# Patient Record
Sex: Female | Born: 1955 | Race: Black or African American | Hispanic: No | Marital: Single | Smoking: Former smoker
Health system: Southern US, Community
[De-identification: ages and names within clinical notes are randomized; demographics above are authoritative.]

## PROBLEM LIST (undated history)

## (undated) DIAGNOSIS — I10 Essential (primary) hypertension: Secondary | ICD-10-CM

## (undated) DIAGNOSIS — Z803 Family history of malignant neoplasm of breast: Secondary | ICD-10-CM

## (undated) DIAGNOSIS — J45909 Unspecified asthma, uncomplicated: Secondary | ICD-10-CM

## (undated) DIAGNOSIS — C541 Malignant neoplasm of endometrium: Secondary | ICD-10-CM

## (undated) DIAGNOSIS — H9190 Unspecified hearing loss, unspecified ear: Secondary | ICD-10-CM

## (undated) DIAGNOSIS — E119 Type 2 diabetes mellitus without complications: Secondary | ICD-10-CM

## (undated) DIAGNOSIS — J449 Chronic obstructive pulmonary disease, unspecified: Secondary | ICD-10-CM

## (undated) HISTORY — PX: BLADDER SURGERY: SHX569

## (undated) HISTORY — DX: Unspecified asthma, uncomplicated: J45.909

## (undated) HISTORY — DX: Family history of malignant neoplasm of breast: Z80.3

## (undated) HISTORY — PX: COLONOSCOPY: SHX174

---

## 2014-01-31 ENCOUNTER — Emergency Department (HOSPITAL_COMMUNITY)
Admission: EM | Admit: 2014-01-31 | Discharge: 2014-01-31 | Disposition: A | Discharge status: Home / Self Care | Payer: Self-pay | Attending: Emergency Medicine | Admitting: Emergency Medicine

## 2014-01-31 ENCOUNTER — Encounter (HOSPITAL_COMMUNITY): Payer: Self-pay | Admitting: Emergency Medicine

## 2014-01-31 ENCOUNTER — Emergency Department (HOSPITAL_COMMUNITY): Payer: Self-pay

## 2014-01-31 DIAGNOSIS — F17200 Nicotine dependence, unspecified, uncomplicated: Secondary | ICD-10-CM | POA: Insufficient documentation

## 2014-01-31 DIAGNOSIS — M7730 Calcaneal spur, unspecified foot: Secondary | ICD-10-CM | POA: Insufficient documentation

## 2014-01-31 DIAGNOSIS — I10 Essential (primary) hypertension: Secondary | ICD-10-CM | POA: Insufficient documentation

## 2014-01-31 DIAGNOSIS — H9190 Unspecified hearing loss, unspecified ear: Secondary | ICD-10-CM | POA: Insufficient documentation

## 2014-01-31 HISTORY — DX: Essential (primary) hypertension: I10

## 2014-01-31 HISTORY — DX: Unspecified hearing loss, unspecified ear: H91.90

## 2014-01-31 MED ORDER — MELOXICAM 7.5 MG PO TABS: 15.0000 mg | ORAL_TABLET | Freq: Every day | ORAL | Status: DC

## 2014-01-31 NOTE — Discharge Instructions (Signed)
Take the prescribed medication as directed. Follow-up with orthopedics for further management of your foot pain. Please establish care with a primary care physician in the area for management of medical problems including high blood pressure.  Resource guide attached to help with this. Return to the ED for new or worsening symptoms.   Emergency Department Resource Guide 1) Find a Doctor and Pay Out of Pocket Although you won't have to find out who is covered by your insurance plan, it is a good idea to ask around and get recommendations. You will then need to call the office and see if the doctor you have chosen will accept you as a new patient and what types of options they offer for patients who are self-pay. Some doctors offer discounts or will set up payment plans for their patients who do not have insurance, but you will need to ask so you aren't surprised when you get to your appointment.  2) Contact Your Local Health Department Not all health departments have doctors that can see patients for sick visits, but many do, so it is worth a call to see if yours does. If you don't know where your local health department is, you can check in your phone book. The CDC also has a tool to help you locate your state's health department, and many state websites also have listings of all of their local health departments.  3) Find a Walk-in Clinic If your illness is not likely to be very severe or complicated, you may want to try a walk in clinic. These are popping up all over the country in pharmacies, drugstores, and shopping centers. They're usually staffed by nurse practitioners or physician assistants that have been trained to treat common illnesses and complaints. They're usually fairly quick and inexpensive. However, if you have serious medical issues or chronic medical problems, these are probably not your best option.  No Primary Care Doctor: - Call Health Connect at  [phone] - they can help you  locate a primary care doctor that  accepts your insurance, provides certain services, etc. - Physician Referral Service- [phone]  Chronic Pain Problems: Organization         Address  Phone   Notes  [facility]  [phone] Patients need to be referred by their primary care doctor.   Medication Assistance: Organization         Address  Phone   Notes  [facility] Medication [facility] [address] [place] [place], [place] [address] [phone] --Must be a resident of [facility] -- Must have NO insurance coverage whatsoever (no Medicaid/ Medicare, etc.) -- The pt. MUST have a primary care doctor that directs their care regularly and follows them in the community   [facility]  [phone]   [employer]  [phone]    Agencies that provide inexpensive medical care: Organization         Address  Phone   Notes  [facility]  [phone]   [facility]    [phone]   [facility] [address] [place], [place] [address] [phone]   [facility] [address] [place], [place] [phone]   [facility]anned Parenthood    [phone]   [facility]    [phone]   Community Health and [facility]  [address], [place] Phone:  [phone], Fax:  [phone] Hours of  Operation:  9 am - 6 pm, M-F.  Also accepts Medicaid/Medicare and self-pay.  [facility] for [place], [address], [place] Phone: [phone], Fax: [phone]. Hours of Operation:  8:30 am - 5:30 pm, M-F.  Also accepts Medicaid and self-pay.  [facility] [address] [place] Phone: [phone]   [place], [place], [place] [phone], Ext. [phone] Mondays & Thursdays: 7-9 AM.  First 15 patients are seen on a first come, first serve basis.    [place]  Providers:  Organization         Address  Phone   Notes  [facility] [address], [place] [phone] Also accepts self-pay patients.  [facility] [address], [place]  [phone]   [place], [address], [place] [phone]   [facility] [address] [place] [phone]   Lucianne Lei [address], [place]   [phone] Only accepts [place] Access [place] patients after they have their name applied to their card.   Self-Pay (no insurance) in [facility]:  Organization         Address  Phone   Notes  Sickle Cell Patients, [facility] Internal Medicine [place] [phone]   [facility] [place] [phone]   [facility] [place]  [place], [place], [place] [phone]   [facility]/Dr. Osei-Bonsu  [address] [place] or [address], [place] [phone] Phone number for both [place] and [place] locations is the same.  Urgent Medical and [facility] [address] [place] [phone]   [facility] [address] [place] or [address] [phone]   [facility] [address] [place] [phone], phone; [phone], fax Sees patients 1st and 3rd Saturday of every month.  Must not qualify for public or private insurance (i.e. Medicaid, Medicare, [place] Health Choice, Veterans' Benefits)  Household income should be no more than 200% of the poverty level The clinic cannot treat you if you are pregnant or think you are pregnant  Sexually transmitted diseases are not treated at the clinic.    Dental Care: Organization         Address  Phone  Notes  [facility] of [facility] [place] [phone] Accepts children up to age 91 who are enrolled in [place] or [place]; pregnant women with a Medicaid card; and children who have applied for Medicaid or [place] Health Choice, but were declined, whose parents can pay a reduced fee at time of service.  [facility] Department of [facility]  [address], [place] [phone] Accepts children up to age 110 who are enrolled in [place] or [place]; pregnant women with a Medicaid card; and children who have applied for Medicaid or [place] Health Choice, but were declined, whose parents can pay a reduced fee at time of service.  [facility]  [place] [phone] Patients are seen by appointment only. Walk-ins are not accepted. [place] will see patients  37 years of age and older. Monday - Tuesday (8am-5pm) Most Wednesdays (8:30-5pm) $30 per visit, cash only  [facility] Adult Dental Access PROGRAM  [address], [facility] [phone] Patients are seen by appointment only. Walk-ins are not accepted. [place] will see patients 38 years of age and older. One Wednesday Evening (Monthly: Volunteer Based).  $30 per visit, cash only  [place]  [phone] for adults; Children under age 20, call Graduate Pediatric Dentistry at [phone]. Children aged 64-14, please call [phone] to request a pediatric application.  Dental services are provided in all areas of dental care including fillings, crowns and bridges, complete and partial dentures, implants, gum treatment, root canals, and extractions. Preventive care is also provided. Treatment is provided to both adults and children. Patients are selected via a lottery and there is often a waiting list.   [facility] [address] [place]  [phone] [email or web address]   [facility]  [address] [place], [place] [phone], Ext. [phone] Second and Fourth Thursday of each month, opens at 6:30 AM; Clinic ends at 9 AM.  Patients are seen on a first-come first-served basis, and a limited number are seen during each clinic.   [facility]  [address] Hillard Danker [place], [place] [phone]   Eligibility Requirements You must have lived in [place], [place], or [place] counties for at least the last three months.   You cannot be eligible for state or federal sponsored [employer], including [employer], [place], or [employer].   You generally cannot be eligible for healthcare insurance through your employer.    How to apply: Eligibility screenings are held every Tuesday and Wednesday afternoon from 1:00 pm until 4:00 pm. You do not need an appointment for the interview!  [facility] [address] [place], [place]   [place]  [place] Department  [place]  [phone]    Behavioral Health Resources in the Community: Intensive Outpatient Programs Organization         Address  Phone  Notes  [facility] [place]. [address] [place], [place] [phone]   [facility] Outpatient [address] [place], [place]   ADS: [facility] [address] [place], [place]   [place] [address],  [place], [place] or [phone]   Substance Abuse Resources Organization         Address  Phone  Notes  [facility] Drug Services  [phone]   [place]  [phone]   [facility] [place]   Chinita Pester  [phone]   Residential & Outpatient Substance Abuse Program  [phone]   Psychological Services Organization         Address  Phone  Notes  [facility] [place]  [place]  [phone]   [place] [address], [place] or [phone]    Mobile Crisis Teams Organization         Address  Phone  Notes  [facility], Mobile Crisis Care Unit  [phone]   [facility]  [address] [place], [place]   [facility] [address] [place] [phone]    Self-Help/Support Groups Organization  Address  Phone             Notes  [place]. of [place] - variety of support groups  [place] Call for more information  Narcotics Anonymous (NA), [facility] [address], [employer] [place]  2 meetings at this location   Special educational needs teacher         Address  Phone  Notes  [facility] Residential Treatment [place],    [place]  [phone]   [facility]  [address], [place] 329924, [place], [place]   [place], [place] [phone] Admissions: 8am-3pm M-F  Incentives Substance [place] [address],    [place], [place] [phone]   [facility] [address] [place], [place], [place]   [facility] [address],  [place], [place]   [facility] Programs - Intensive Outpatient [address], [place], [place]   [facility]  [place], [place] [phone] or [phone]   [facility] (RTS) [address] [place], [place] Accepts Medicaid  [facility] [address]  [place] [phone] Substance Abuse/Addiction Treatment   [facility] Organization         Address  Phone  Notes  [facility]  [phone]   Domenic Schwab, PhD [address] Arlis Porta [place], [place]   [phone] or [phone]    [place], [place] [phone]   [facility] [address], [place], [place] [phone] Insurance/Medicaid/sponsorship through [facility] and Families [address]                                    [place], [place] [phone] [place] [address], [place] [phone]    Dr. Adele Schilder  [phone]   [facility] Dept. 1) [address], [place] 2) [place] 3)  [place] [address], [place] [phone]  [phone]   [place] [phone] or [phone] (After Hours)

## 2014-01-31 NOTE — ED Provider Notes (Signed)
CSN: [id number]     Arrival date & time 01/31/14  1136 History  This chart was scribed for non-physician practitioner Sharilyn SitesLisa Tyliah Schlereth, PA-C working with Doug SouSam Jacubowitz, MD by Leone PayorSonum Patel, ED Scribe. This patient was seen in room TR05C/TR05C and the patient's care was started at 1:42 PM.    Chief Complaint  Patient presents with  . Foot Pain      The history is provided by the patient. The history is limited by a language barrier. A language interpreter was used.   HPI Comments: Katelyn AbideMichele Winters is a 58 y.o. female who presents to the Emergency Department complaining of constant, unchanged right heel pain that began 2 years ago. Patient reports similar symptoms on the left heel which required surgery. She is also requesting HTN medication refill today-- does not know what prior medications were. She denies numbness, paresthesias, or weakness of right foot.  No intervention tried PTA.   Past Medical History  Diagnosis Date  . Hypertension   . Deaf    No past surgical history on file. No family history on file. History  Substance Use Topics  . Smoking status: Current Every Day Smoker  . Smokeless tobacco: Not on file  . Alcohol Use: Yes   OB History   Grav Para Term Preterm Abortions TAB SAB Ect Mult Living                 Review of Systems  Musculoskeletal: Positive for arthralgias.  Neurological: Negative for weakness and numbness.  All other systems reviewed and are negative.     Allergies  Review of patient's allergies indicates no known allergies.  Home Medications   Prior to Admission medications   Not on File   BP 147/88  Pulse 69  Temp(Src) 98 F (36.7 C) (Oral)  SpO2 98%  Physical Exam  Nursing note and vitals reviewed. Constitutional: She is oriented to person, place, and time. She appears well-developed and well-nourished.  HENT:  Head: Normocephalic and atraumatic.  Mouth/Throat: Oropharynx is clear and moist.  Eyes: Conjunctivae and EOM are normal.  Pupils are equal, round, and reactive to light.  Neck: Normal range of motion.  Cardiovascular: Normal rate, regular rhythm, normal heart sounds and intact distal pulses.   Pulmonary/Chest: Effort normal and breath sounds normal.  Musculoskeletal: Normal range of motion. She exhibits tenderness.  Right heel tender along posterior aspect. Achilles non tender and appears intact. Strong distal pulses. Cap refill intact. Sensation intact. Ambulating without difficulty.   Neurological: She is alert and oriented to person, place, and time. No sensory deficit.  Skin: Skin is warm and dry.  Psychiatric: She has a normal mood and affect.    ED Course  Procedures (including critical care time)  DIAGNOSTIC STUDIES: Oxygen Saturation is 98% on RA, normal by my interpretation.    COORDINATION OF CARE: 1:43 PM Discussed treatment plan with pt at bedside and pt agreed to plan.   Labs Review Labs Reviewed - No data to display  Imaging Review Dg Foot Complete Right  01/31/2014   CLINICAL DATA:  Right foot pain  EXAM: RIGHT FOOT COMPLETE - 3+ VIEW  COMPARISON:  None.  FINDINGS: Calcaneal spurs are identified. Degenerative changes are noted in the tarsal bones. No acute fracture or dislocation seen. No soft tissue changes are noted.  IMPRESSION: Degenerative change without acute abnormality.   Electronically Signed   By: Alcide CleverMark  Lukens M.D.   On: 01/31/2014 13:34     EKG Interpretation None  MDM   Final diagnoses:  Heel spur   X-ray revealing calcaneal spurs which is likely the source of patient's pain. She was started on anti-inflammatories.  She has a history of hypertension but is not currently on any medications. She is unsure what her prior medications were. I strongly encouraged her to followup with a primary care physician in the area to establish care and restart her proper medications.  Pt also given orthopedic FU if needed.  Discussed plan with patient, he/she acknowledged understanding  and agreed with plan of care.  Return precautions given for new or worsening symptoms.  I personally performed the services described in this documentation, which was scribed in my presence. The recorded information has been reviewed and is accurate.  Garlon Hatchet, PA-C 01/31/14 1614

## 2014-01-31 NOTE — ED Notes (Signed)
Interpreter in. Pt c/o left heel pain x 2 years. No known injury. Here from [place] since April. Has not taken BP meds since then.

## 2014-01-31 NOTE — ED Notes (Signed)
Pt is deaf. Requests interpreter. C/o left heel pain x 2 years.

## 2014-01-31 NOTE — ED Provider Notes (Signed)
Medical screening examination/treatment/procedure(s) were performed by non-physician practitioner and as supervising physician I was immediately available for consultation/collaboration.   EKG Interpretation None       Doug Sou, MD 01/31/14 1615

## 2014-01-31 NOTE — ED Notes (Signed)
Called for interpreter 

## 2014-01-31 NOTE — Discharge Planning (Signed)
[facility] Mudlogger in the room. Patient states she recently moved to [facility] in April and hasn't established care with a provider. Patient was given a resource guide and orange card application. Informed patient that she might be a good candidate for medicaid. Pt states she has family who works in the system and expressed concerns of medicaid effecting her monthly income. Patient was given my contact information for any future questions or concerns.

## 2014-11-19 ENCOUNTER — Emergency Department (HOSPITAL_COMMUNITY): Payer: Self-pay

## 2014-11-19 ENCOUNTER — Emergency Department (HOSPITAL_COMMUNITY)
Admission: EM | Admit: 2014-11-19 | Discharge: 2014-11-19 | Disposition: A | Discharge status: Home / Self Care | Payer: Self-pay | Attending: Emergency Medicine | Admitting: Emergency Medicine

## 2014-11-19 ENCOUNTER — Encounter (HOSPITAL_COMMUNITY): Payer: Self-pay | Admitting: *Deleted

## 2014-11-19 DIAGNOSIS — Z791 Long term (current) use of non-steroidal anti-inflammatories (NSAID): Secondary | ICD-10-CM | POA: Insufficient documentation

## 2014-11-19 DIAGNOSIS — R1013 Epigastric pain: Secondary | ICD-10-CM | POA: Insufficient documentation

## 2014-11-19 DIAGNOSIS — R630 Anorexia: Secondary | ICD-10-CM | POA: Insufficient documentation

## 2014-11-19 DIAGNOSIS — I10 Essential (primary) hypertension: Secondary | ICD-10-CM | POA: Insufficient documentation

## 2014-11-19 DIAGNOSIS — R112 Nausea with vomiting, unspecified: Secondary | ICD-10-CM | POA: Insufficient documentation

## 2014-11-19 DIAGNOSIS — R109 Unspecified abdominal pain: Secondary | ICD-10-CM

## 2014-11-19 DIAGNOSIS — Z720 Tobacco use: Secondary | ICD-10-CM | POA: Insufficient documentation

## 2014-11-19 DIAGNOSIS — H9190 Unspecified hearing loss, unspecified ear: Secondary | ICD-10-CM | POA: Insufficient documentation

## 2014-11-19 LAB — URINALYSIS, ROUTINE W REFLEX MICROSCOPIC
Bilirubin Urine: NEGATIVE
Glucose, UA: NEGATIVE mg/dL
Hgb urine dipstick: NEGATIVE
Ketones, ur: NEGATIVE mg/dL
Leukocytes, UA: NEGATIVE
Nitrite: NEGATIVE
PH: 6 (ref 5.0–8.0)
Protein, ur: NEGATIVE mg/dL
Specific Gravity, Urine: 1.02 (ref 1.005–1.030)
UROBILINOGEN UA: 1 mg/dL (ref 0.0–1.0)

## 2014-11-19 LAB — CBC WITH DIFFERENTIAL/PLATELET
Basophils Absolute: 0 10*3/uL (ref 0.0–0.1)
Basophils Relative: 1 % (ref 0–1)
Eosinophils Absolute: 0.1 10*3/uL (ref 0.0–0.7)
Eosinophils Relative: 1 % (ref 0–5)
HCT: 38.6 % (ref 36.0–46.0)
Hemoglobin: 12.8 g/dL (ref 12.0–15.0)
LYMPHS ABS: 1.6 10*3/uL (ref 0.7–4.0)
LYMPHS PCT: 24 % (ref 12–46)
MCH: 32.8 pg (ref 26.0–34.0)
MCHC: 33.2 g/dL (ref 30.0–36.0)
MCV: 99 fL (ref 78.0–100.0)
Monocytes Absolute: 0.4 10*3/uL (ref 0.1–1.0)
Monocytes Relative: 6 % (ref 3–12)
NEUTROS ABS: 4.5 10*3/uL (ref 1.7–7.7)
Neutrophils Relative %: 68 % (ref 43–77)
PLATELETS: 273 10*3/uL (ref 150–400)
RBC: 3.9 MIL/uL (ref 3.87–5.11)
RDW: 12.3 % (ref 11.5–15.5)
WBC: 6.6 10*3/uL (ref 4.0–10.5)

## 2014-11-19 LAB — COMPREHENSIVE METABOLIC PANEL
ALBUMIN: 3.7 g/dL (ref 3.5–5.2)
ALK PHOS: 90 U/L (ref 39–117)
ALT: 14 U/L (ref 0–35)
AST: 17 U/L (ref 0–37)
Anion gap: 8 (ref 5–15)
BUN: 11 mg/dL (ref 6–23)
CHLORIDE: 105 mmol/L (ref 96–112)
CO2: 26 mmol/L (ref 19–32)
Calcium: 8.7 mg/dL (ref 8.4–10.5)
Creatinine, Ser: 0.91 mg/dL (ref 0.50–1.10)
GFR, EST AFRICAN AMERICAN: 78 mL/min — AB (ref >90)
GFR, EST NON AFRICAN AMERICAN: 67 mL/min — AB (ref >90)
GLUCOSE: 172 mg/dL — AB (ref 70–99)
Potassium: 3.9 mmol/L (ref 3.5–5.1)
Sodium: 139 mmol/L (ref 135–145)
Total Bilirubin: 0.8 mg/dL (ref 0.3–1.2)
Total Protein: 7.4 g/dL (ref 6.0–8.3)

## 2014-11-19 MED ORDER — ONDANSETRON HCL 4 MG PO TABS: 4.0000 mg | ORAL_TABLET | Freq: Four times a day (QID) | ORAL | Status: DC

## 2014-11-19 MED ORDER — MORPHINE SULFATE 4 MG/ML IJ SOLN
4.0000 mg | Freq: Once | INTRAMUSCULAR | Status: AC
  Administered 2014-11-19: 4 mg via INTRAVENOUS
  Filled 2014-11-19: qty 1

## 2014-11-19 MED ORDER — ONDANSETRON HCL 4 MG/2ML IJ SOLN
4.0000 mg | Freq: Once | INTRAMUSCULAR | Status: AC
  Administered 2014-11-19: 4 mg via INTRAVENOUS
  Filled 2014-11-19: qty 2

## 2014-11-19 MED ORDER — HYDROCODONE-ACETAMINOPHEN 5-325 MG PO TABS: 1.0000 | ORAL_TABLET | Freq: Four times a day (QID) | ORAL | Status: DC | PRN

## 2014-11-19 NOTE — ED Notes (Signed)
Bed: [id number] Expected date: 11/19/14 Expected time: 10:35 AM Means of arrival: Ambulance Comments: RM 25, abd pain

## 2014-11-19 NOTE — ED Notes (Signed)
ES reports pt developed abd pain 12 hours ago, nausea and vomiting. Rt flank pain upon palpation. PT IS HEARING IMPAIRED  IV l FA # 20, Zofran given

## 2014-11-19 NOTE — ED Notes (Signed)
Pt aware of need for urine specimen. 

## 2014-11-19 NOTE — ED Notes (Signed)
AVS explained with interpreter. Given cab voucher per social work. No other c/c.

## 2014-11-19 NOTE — ED Notes (Signed)
Interpreter at bedside for effective communication purposes. MD Plunkett at bedside to assess patient.

## 2014-11-19 NOTE — Discharge Instructions (Signed)
Abdominal Pain, Women °Abdominal (stomach, pelvic, or belly) pain can be caused by many things. It is important to tell your doctor: °· The location of the pain. °· Does it come and go or is it present all the time? °· Are there things that start the pain (eating certain foods, exercise)? °· Are there other symptoms associated with the pain (fever, nausea, vomiting, diarrhea)? °All of this is helpful to know when trying to find the cause of the pain. °CAUSES  °· Stomach: virus or bacteria infection, or ulcer. °· Intestine: appendicitis (inflamed appendix), regional ileitis (Crohn's disease), ulcerative colitis (inflamed colon), irritable bowel syndrome, diverticulitis (inflamed diverticulum of the colon), or cancer of the stomach or intestine. °· Gallbladder disease or stones in the gallbladder. °· Kidney disease, kidney stones, or infection. °· Pancreas infection or cancer. °· Fibromyalgia (pain disorder). °· Diseases of the female organs: °¨ Uterus: fibroid (non-cancerous) tumors or infection. °¨ Fallopian tubes: infection or tubal pregnancy. °¨ Ovary: cysts or tumors. °¨ Pelvic adhesions (scar tissue). °¨ Endometriosis (uterus lining tissue growing in the pelvis and on the pelvic organs). °¨ Pelvic congestion syndrome (female organs filling up with blood just before the menstrual period). °¨ Pain with the menstrual period. °¨ Pain with ovulation (producing an egg). °¨ Pain with an IUD (intrauterine device, birth control) in the uterus. °¨ Cancer of the female organs. °· Functional pain (pain not caused by a disease, may improve without treatment). °· Psychological pain. °· Depression. °DIAGNOSIS  °Your doctor will decide the seriousness of your pain by doing an examination. °· Blood tests. °· X-rays. °· Ultrasound. °· CT scan (computed tomography, special type of X-ray). °· MRI (magnetic resonance imaging). °· Cultures, for infection. °· Barium enema (dye inserted in the large intestine, to better view it with  X-rays). °· Colonoscopy (looking in intestine with a lighted tube). °· Laparoscopy (minor surgery, looking in abdomen with a lighted tube). °· Major abdominal exploratory surgery (looking in abdomen with a large incision). °TREATMENT  °The treatment will depend on the cause of the pain.  °· Many cases can be observed and treated at home. °· Over-the-counter medicines recommended by your caregiver. °· Prescription medicine. °· Antibiotics, for infection. °· Birth control pills, for painful periods or for ovulation pain. °· Hormone treatment, for endometriosis. °· Nerve blocking injections. °· Physical therapy. °· Antidepressants. °· Counseling with a psychologist or psychiatrist. °· Minor or major surgery. °HOME CARE INSTRUCTIONS  °· Do not take laxatives, unless directed by your caregiver. °· Take over-the-counter pain medicine only if ordered by your caregiver. Do not take aspirin because it can cause an upset stomach or bleeding. °· Try a clear liquid diet (broth or water) as ordered by your caregiver. Slowly move to a bland diet, as tolerated, if the pain is related to the stomach or intestine. °· Have a thermometer and take your temperature several times a day, and record it. °· Bed rest and sleep, if it helps the pain. °· Avoid sexual intercourse, if it causes pain. °· Avoid stressful situations. °· Keep your follow-up appointments and tests, as your caregiver orders. °· If the pain does not go away with medicine or surgery, you may try: °¨ Acupuncture. °¨ Relaxation exercises (yoga, meditation). °¨ Group therapy. °¨ Counseling. °SEEK MEDICAL CARE IF:  °· You notice certain foods cause stomach pain. °· Your home care treatment is not helping your pain. °· You need stronger pain medicine. °· You want your IUD removed. °· You feel faint or   lightheaded. °· You develop nausea and vomiting. °· You develop a rash. °· You are having side effects or an allergy to your medicine. °SEEK IMMEDIATE MEDICAL CARE IF:  °· Your  pain does not go away or gets worse. °· You have a fever. °· Your pain is felt only in portions of the abdomen. The right side could possibly be appendicitis. The left lower portion of the abdomen could be colitis or diverticulitis. °· You are passing blood in your stools (bright red or black tarry stools, with or without vomiting). °· You have blood in your urine. °· You develop chills, with or without a fever. °· You pass out. °MAKE SURE YOU:  °· Understand these instructions. °· Will watch your condition. °· Will get help right away if you are not doing well or get worse. °Document Released: 06/15/2007 Document Revised: 01/02/2014 Document Reviewed: 07/05/2009 °ExitCare® Patient Information ©2015 [employer]. This information is not intended to replace advice given to you by your health care provider. Make sure you discuss any questions you have with your health care provider. ° °

## 2014-11-19 NOTE — ED Provider Notes (Signed)
CSN: [id number]     Arrival date & time 11/19/14  1037 History   First MD Initiated Contact with Patient 11/19/14 1044     Chief Complaint  Patient presents with  . Abdominal Pain     (Consider location/radiation/quality/duration/timing/severity/associated sxs/prior Treatment) Patient is a 59 y.o. female presenting with abdominal pain. The history is provided by the patient. The history is limited by a language barrier (pt is deaf). A language interpreter was used.  Abdominal Pain Pain location:  L flank Pain quality: sharp and stabbing   Pain radiates to:  Suprapubic region Pain severity:  Severe Onset quality:  Sudden Duration:  12 hours Timing:  Constant Progression:  Worsening Chronicity:  New Context: awakening from sleep   Relieved by:  Nothing Worsened by:  Urination Ineffective treatments:  Acetaminophen Associated symptoms: anorexia, dysuria, nausea and vomiting   Associated symptoms: no constipation, no cough, no fever and no hematuria   Associated symptoms comment:  States urine stream is slow Risk factors: has not had multiple surgeries and no recent hospitalization     Past Medical History  Diagnosis Date  . Hypertension   . Deaf    History reviewed. No pertinent past surgical history. No family history on file. History  Substance Use Topics  . Smoking status: Current Every Day Smoker  . Smokeless tobacco: Not on file  . Alcohol Use: Yes   OB History    No data available     Review of Systems  Constitutional: Negative for fever.  Respiratory: Negative for cough.   Gastrointestinal: Positive for nausea, vomiting, abdominal pain and anorexia. Negative for constipation.  Genitourinary: Positive for dysuria. Negative for hematuria.  All other systems reviewed and are negative.     Allergies  Review of patient's allergies indicates no known allergies.  Home Medications   Prior to Admission medications   Medication Sig Start Date End Date Taking?  Authorizing Provider  acetaminophen (TYLENOL) 325 MG tablet Take 325 mg by mouth every 6 (six) hours as needed for moderate pain.   Yes Historical Provider, MD  Multiple Vitamin (MULTIVITAMIN WITH MINERALS) TABS tablet Take 1 tablet by mouth daily.   Yes Historical Provider, MD  meloxicam (MOBIC) 7.5 MG tablet Take 2 tablets (15 mg total) by mouth daily. Patient not taking: Reported on 11/19/2014 01/31/14   Garlon HatchetLisa M Sanders, PA-C   BP 134/82 mmHg  Pulse 73  Temp(Src) 97.7 F (36.5 C) (Oral)  Resp 18  SpO2 98% Physical Exam  Constitutional: She is oriented to person, place, and time. She appears well-developed and well-nourished. No distress.  HENT:  Head: Normocephalic and atraumatic.  Mouth/Throat: Oropharynx is clear and moist.  Eyes: Conjunctivae and EOM are normal. Pupils are equal, round, and reactive to light.  Neck: Normal range of motion. Neck supple.  Cardiovascular: Normal rate, regular rhythm and intact distal pulses.   No murmur heard. Pulmonary/Chest: Effort normal and breath sounds normal. No respiratory distress. She has no wheezes. She has no rales.  Abdominal: Soft. She exhibits no distension. There is tenderness in the suprapubic area. There is CVA tenderness. There is no rebound and no guarding.  Severe left flank pain and mild right flank pain  Musculoskeletal: Normal range of motion. She exhibits no edema or tenderness.  Neurological: She is alert and oriented to person, place, and time.  Skin: Skin is warm and dry. No rash noted. No erythema.  Psychiatric: She has a normal mood and affect. Her behavior is normal.  Nursing  note and vitals reviewed.   ED Course  Procedures (including critical care time) Labs Review Labs Reviewed  COMPREHENSIVE METABOLIC PANEL - Abnormal; Notable for the following:    Glucose, Bld 172 (*)    GFR calc non Af Amer 67 (*)    GFR calc Af Amer 78 (*)    All other components within normal limits  CBC WITH DIFFERENTIAL/PLATELET   URINALYSIS, ROUTINE W REFLEX MICROSCOPIC    Imaging Review Ct Abdomen Pelvis Wo Contrast  11/19/2014   CLINICAL DATA:  Right flank pain  EXAM: CT ABDOMEN AND PELVIS WITHOUT CONTRAST  TECHNIQUE: Multidetector CT imaging of the abdomen and pelvis was performed following the standard protocol without IV contrast.  COMPARISON:  None.  FINDINGS: Lung bases demonstrate some minimal scarring on the right.  The gallbladder has been surgically removed. The liver, spleen, adrenal glands and pancreas are within normal limits. Kidneys are well visualized bilaterally. No renal calculi or obstructive changes are noted.  The appendix is within normal limits. Diverticular change of the colon is noted. No pelvic mass lesion is seen. The bladder is partially distended. No acute bony abnormality is noted. The inferior vena cava is diminutive stool ash absent with significant collateral flow to the azygos system.  IMPRESSION: No acute abnormality identified.  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   Electronically Signed   By: Alcide Clever M.D.   On: 11/19/2014 13:28     EKG Interpretation None      MDM   Final diagnoses:  Flank pain  Epigastric pain    Pt with symptoms most consistent with kidney stone that started abruptly last night.  Denies infectious sx, or GI symptoms.  Low concern for diverticulitis and no risk factors or history suggestive of AAA.  No hx suggestive of GU source (discharge) and otherwise pt is healthy.  Will hydrate, treat pain and ensure no infection with UA, CBC, CMP and will get stone study to further eval.   1:53 PM Labs are all within normal limits and imaging is negative. Patient now has complete resolution of her symptoms. We'll discharge home with strict return precautions.  Gwyneth Sprout, MD 11/19/14 1353

## 2015-01-26 ENCOUNTER — Emergency Department (HOSPITAL_COMMUNITY)
Admission: EM | Admit: 2015-01-26 | Discharge: 2015-01-26 | Disposition: A | Discharge status: Home / Self Care | Payer: Self-pay | Attending: Emergency Medicine | Admitting: Emergency Medicine

## 2015-01-26 ENCOUNTER — Encounter (HOSPITAL_COMMUNITY): Payer: Self-pay | Admitting: Physical Medicine and Rehabilitation

## 2015-01-26 DIAGNOSIS — K088 Other specified disorders of teeth and supporting structures: Secondary | ICD-10-CM | POA: Insufficient documentation

## 2015-01-26 DIAGNOSIS — K0889 Other specified disorders of teeth and supporting structures: Secondary | ICD-10-CM

## 2015-01-26 DIAGNOSIS — Z791 Long term (current) use of non-steroidal anti-inflammatories (NSAID): Secondary | ICD-10-CM | POA: Insufficient documentation

## 2015-01-26 DIAGNOSIS — H9190 Unspecified hearing loss, unspecified ear: Secondary | ICD-10-CM | POA: Insufficient documentation

## 2015-01-26 DIAGNOSIS — I10 Essential (primary) hypertension: Secondary | ICD-10-CM | POA: Insufficient documentation

## 2015-01-26 DIAGNOSIS — Z79899 Other long term (current) drug therapy: Secondary | ICD-10-CM | POA: Insufficient documentation

## 2015-01-26 DIAGNOSIS — Z720 Tobacco use: Secondary | ICD-10-CM | POA: Insufficient documentation

## 2015-01-26 MED ORDER — PENICILLIN V POTASSIUM 500 MG PO TABS: 500.0000 mg | ORAL_TABLET | Freq: Three times a day (TID) | ORAL | Status: AC

## 2015-01-26 MED ORDER — PENICILLIN V POTASSIUM 250 MG PO TABS
500.0000 mg | ORAL_TABLET | Freq: Once | ORAL | Status: AC
  Administered 2015-01-26: 500 mg via ORAL
  Filled 2015-01-26: qty 2

## 2015-01-26 MED ORDER — IBUPROFEN 600 MG PO TABS: 600.0000 mg | ORAL_TABLET | Freq: Four times a day (QID) | ORAL | Status: DC | PRN

## 2015-01-26 MED ORDER — IBUPROFEN 400 MG PO TABS
600.0000 mg | ORAL_TABLET | Freq: Once | ORAL | Status: AC
  Administered 2015-01-26: 600 mg via ORAL
  Filled 2015-01-26: qty 3

## 2015-01-26 NOTE — ED Provider Notes (Signed)
CSN: [id number]     Arrival date & time 01/26/15  1357 History  This chart was scribed for Jaynie Crumbleatyana Tijah Hane, PA-C working with Glynn OctaveStephen Rancour, MD by Elveria Risingimelie Horne, ED Scribe. This patient was seen in room TR04C/TR04C and the patient's care was started at 2:56 PM.   Chief Complaint  Patient presents with  . Dental Pain   The history is provided by the patient. No language interpreter was used.   HPI Comments: Katelyn Winters is a 59 y.o. female with PMHx of Hypertension and deafness who presents to the Emergency Department complaining of left upper rear dental pain after eating today. Patient states that the tooth needs to pulled and that she is unsure if she has an infection. Patient states she has not taken any medication because the pain is too severe for any medication to relieve. Patient states that she is new to the area and does not have a dentist. Patient has had several right upper teeth extracted.   Past Medical History  Diagnosis Date  . Hypertension   . Deaf    History reviewed. No pertinent past surgical history. No family history on file. History  Substance Use Topics  . Smoking status: Current Every Day Smoker    Types: Cigarettes  . Smokeless tobacco: Not on file  . Alcohol Use: Yes   OB History    No data available     Review of Systems  Constitutional: Negative for fever.  HENT: Positive for dental problem. Negative for ear discharge.       Allergies  Review of patient's allergies indicates no known allergies.  Home Medications   Prior to Admission medications   Medication Sig Start Date End Date Taking? Authorizing Provider  acetaminophen (TYLENOL) 325 MG tablet Take 325 mg by mouth every 6 (six) hours as needed for moderate pain.    Historical Provider, MD  HYDROcodone-acetaminophen (NORCO/VICODIN) 5-325 MG per tablet Take 1-2 tablets by mouth every 6 (six) hours as needed. 11/19/14   Gwyneth SproutWhitney Plunkett, MD  meloxicam (MOBIC) 7.5 MG tablet Take 2 tablets  (15 mg total) by mouth daily. Patient not taking: Reported on 11/19/2014 01/31/14   Garlon HatchetLisa M Sanders, PA-C  Multiple Vitamin (MULTIVITAMIN WITH MINERALS) TABS tablet Take 1 tablet by mouth daily.    Historical Provider, MD  ondansetron (ZOFRAN) 4 MG tablet Take 1 tablet (4 mg total) by mouth every 6 (six) hours. 11/19/14   Gwyneth SproutWhitney Plunkett, MD   Triage Vitals: BP 125/83 mmHg  Pulse 88  Temp(Src) 97.9 F (36.6 C) (Oral)  Resp 18  SpO2 93% Physical Exam  Constitutional: She is oriented to person, place, and time. She appears well-developed and well-nourished. No distress.  HENT:  Head: Normocephalic and atraumatic.  Overall poor dentition. Left upper second molar is tender to palpation., Appears to be normal with no surrounding swelling or obvious abscess. Tooth appears to be mildly loose. No facial swelling. The swelling under the tongue.  Eyes: EOM are normal.  Neck: Neck supple. No tracheal deviation present.  Cardiovascular: Normal rate.   Pulmonary/Chest: Effort normal. No respiratory distress.  Musculoskeletal: Normal range of motion.  Lymphadenopathy:    She has no cervical adenopathy.  Neurological: She is alert and oriented to person, place, and time.  Skin: Skin is warm and dry.  Psychiatric: She has a normal mood and affect. Her behavior is normal.  Nursing note and vitals reviewed.   ED Course  Procedures (including critical care time)  COORDINATION OF CARE: 3:01 PM-  Pans to prescribe antibiotics and prescribe resource guide. Discussed treatment plan with patient's parent at bedside and parent agreed to plan.   Labs Review Labs Reviewed - No data to display  Imaging Review No results found.   EKG Interpretation None      MDM   Final diagnoses:  Pain, dental    Patient with dental pain, requesting to get tooth pulled out. Unable to pull it out in emergency department, will start on penicillin given possible infection since tooth is somewhat loose. Patient was  seen by case manager as well because of issues with transportation to and from appointments and no family or friends in the area. Apparently patient is going back to [place] tomorrow to take care of her mother and she will go to the hospital there to get her tooth pulled out. She will then be set up with the [facility] for further primary care doctor when she comes back. No other complaints.   Filed Vitals:   01/26/15 1407 01/26/15 1620  BP: 125/83 130/83  Pulse: 88 72  Temp: 97.9 F (36.6 C) 97.9 F (36.6 C)  TempSrc: Oral Oral  Resp: 18 16  SpO2: 93% 100%   I personally performed the services described in this documentation, which was scribed in my presence. The recorded information has been reviewed and is accurate.      Jaynie Crumble, PA-C 01/26/15 1637  Glynn Octave, MD 01/26/15 [phone]

## 2015-01-26 NOTE — ED Notes (Signed)
Pt presents to department for evaluation of L upper dental pain. Ongoing x1 month. 9/10 pain upon arrival. Pt is alert and oriented x4. Pt is deaf, but able to read lips.

## 2015-01-26 NOTE — ED Notes (Signed)
Called for triage no response 

## 2015-01-26 NOTE — ED Notes (Signed)
Left detailed message for interpreter to call me back ASAP.

## 2015-01-26 NOTE — Discharge Instructions (Signed)
Take ibuprofen for pain as prescribed. Take penicillin as prescribed until all gone. Follow up with a dentist   Dental Pain A tooth ache may be caused by cavities (tooth decay). Cavities expose the nerve of the tooth to air and hot or cold temperatures. It may come from an infection or abscess (also called a boil or furuncle) around your tooth. It is also often caused by dental caries (tooth decay). This causes the pain you are having. DIAGNOSIS  Your caregiver can diagnose this problem by exam. TREATMENT   If caused by an infection, it may be treated with medications which kill germs (antibiotics) and pain medications as prescribed by your caregiver. Take medications as directed.  Only take over-the-counter or prescription medicines for pain, discomfort, or fever as directed by your caregiver.  Whether the tooth ache today is caused by infection or dental disease, you should see your dentist as soon as possible for further care. SEEK MEDICAL CARE IF: The exam and treatment you received today has been provided on an emergency basis only. This is not a substitute for complete medical or dental care. If your problem worsens or new problems (symptoms) appear, and you are unable to meet with your dentist, call or return to this location. SEEK IMMEDIATE MEDICAL CARE IF:   You have a fever.  You develop redness and swelling of your face, jaw, or neck.  You are unable to open your mouth.  You have severe pain uncontrolled by pain medicine. MAKE SURE YOU:   Understand these instructions.  Will watch your condition.  Will get help right away if you are not doing well or get worse. Document Released: 08/18/2005 Document Revised: 11/10/2011 Document Reviewed: 04/05/2008 [facility] Patient Information 2015 [place], [place]. This information is not intended to replace advice given to you by your health care provider. Make sure you discuss any questions you have with your health care provider. .    Emergency Department Resource Guide 1) Find a Doctor and Pay Out of Pocket Although you won't have to find out who is covered by your insurance plan, it is a good idea to ask around and get recommendations. You will then need to call the office and see if the doctor you have chosen will accept you as a new patient and what types of options they offer for patients who are self-pay. Some doctors offer discounts or will set up payment plans for their patients who do not have insurance, but you will need to ask so you aren't surprised when you get to your appointment.  2) Contact Your Local Health Department Not all health departments have doctors that can see patients for sick visits, but many do, so it is worth a call to see if yours does. If you don't know where your local health department is, you can check in your phone book. The CDC also has a tool to help you locate your state's health department, and many state websites also have listings of all of their local health departments.  3) Find a Walk-in Clinic If your illness is not likely to be very severe or complicated, you may want to try a walk in clinic. These are popping up all over the country in pharmacies, drugstores, and shopping centers. They're usually staffed by nurse practitioners or physician assistants that have been trained to treat common illnesses and complaints. They're usually fairly quick and inexpensive. However, if you have serious medical issues or chronic medical problems, these are probably not your  best option.  No Primary Care Doctor: - Call Health Connect at  [phone] - they can help you locate a primary care doctor that  accepts your insurance, provides certain services, etc. - Physician Referral Service- [phone]  Chronic Pain Problems: Organization         Address  Phone   Notes  [facility]  [phone] Patients need to be referred by their primary care doctor.   Medication  Assistance: Organization         Address  Phone   Notes  [facility] Medication [facility] [address] [place], [place] [address] [phone] --Must be a resident of [facility] -- Must have NO insurance coverage whatsoever (no Medicaid/ Medicare, etc.) -- The pt. MUST have a primary care doctor that directs their care regularly and follows them in the community   MedAssist  [phone]   [employer]  [phone]    Agencies that provide inexpensive medical care: Organization         Address  Phone   Notes  [facility]  [phone]   [facility]    [phone]   [facility] [address] [place], [place] [address] [phone]   [facility] [address], [place] [phone]   [facility]    [phone]) [phone]   [facility]    [phone]   [facility]  [address], [place] Phone:  [phone], Fax:  [phone]) [phone] Hours of Operation:  9 am - 6 pm, M-F.  Also accepts Medicaid/Medicare and self-pay.  [facility]  [address], [place] Phone: [phone], Fax: [phone]. Hours of Operation:  8:30 am - 5:30 pm, M-F.  Also accepts Medicaid and self-pay.  [facility] [place] [address], [place] Phone: [phone]   [facility] [address], [place], [place] [phone], Ext. [phone] Mondays & Thursdays: 7-9 AM.  First 15 patients are seen on a first come, first serve basis.    Medicaid-accepting [facility] [place] Providers:  Organization         Address  Phone   Notes  [facility] [address], [place] [phone] Also accepts self-pay patients.  [facility] [address], [place]  [phone]   [facility] [address], [place]  [phone]) [phone]   [facility] [address] [place] [address], [place] [phone]   Renaye RakersVeita Bland [address], [place]   [phone] Only accepts [place] Access [place] patients after they have their name applied to their card.   Self-Pay (no insurance) in [facility] [place]:  Organization         Address  Phone   Notes  Sickle Cell Patients, [facility] [address], [place] [phone]) [phone]   [facility] [address], [place] [phone]) [phone]   [facility] [place]  [address], [place] [phone]   [facility]/Dr. Osei-Bonsu  [address] [place] [address], [place] or [id number], [place] [phone] Phone number for both [place] and [place] locations is the same.  [facility] [address],  [place] [phone]) [phone]   [facility] [place] [address] [place] [address], [place] or [address] [phone]   [facility] [address], [place] [phone], phone; [phone]) [phone], fax Sees patients 1st and 3rd Saturday of every month.  Must not qualify for public or private insurance (i.e. Medicaid, Medicare, [place] Health Choice, Veterans' Benefits)  Household income should be no more than 200% of the poverty level The clinic cannot treat you if you are pregnant or think you are pregnant  Sexually transmitted diseases are not treated at the clinic.    Dental Care: Organization         Address  Phone  Notes  [facility] of [facility] [address], [place] [phone] Accepts children up to age 621 who are enrolled in [place] or [place] Health Choice; pregnant women with a Medicaid card; and children who have applied for Medicaid or [place] Health Choice, but were declined, whose parents can pay a reduced fee at time of service.  [facility]  Department of [facility] [place]  [address], [place] [phone] Accepts children up to age 60 who are enrolled in [place] or [place] Health Choice; pregnant women with a Medicaid card; and children who have applied for Medicaid or [place] Health Choice, but were declined, whose parents can pay a reduced fee at time of service.  [facility]  [address], [place] [phone] Patients are seen by appointment only. Walk-ins are not accepted. [facility] will see patients 60 years of age and older. Monday - Tuesday (8am-5pm) Most Wednesdays (8:30-5pm) $30 per visit, cash only  [facility]  [address], [place] [place] [facility] [place] [phone] Patients are seen by appointment only. Walk-ins are not accepted. [facility] will see patients 60 years of age and older. One Wednesday Evening (Monthly: Volunteer Based).  $30 per visit, cash only  [facility]  [phone] for adults; Children under age 774, call Graduate Pediatric Dentistry at [phone]. Children aged 64-14, please call [phone] to request a pediatric application.  Dental services are provided in all areas of dental care including fillings, crowns and bridges, complete and partial dentures, implants, gum treatment, root canals, and extractions. Preventive care is also provided. Treatment is provided to both adults and children. Patients are selected via a lottery and there is often a waiting list.   [facility] [address], [place]  [phone] [email or web address]   [facility] [address], [place], [place] [phone], Ext. [phone] Second and Fourth Thursday of each month, opens at 6:30 AM; Clinic ends at 9 AM.  Patients are seen on a first-come first-served basis, and a limited number are seen during each clinic.   [facility]  [address], [place], [place] [phone]) [phone]    Eligibility Requirements You must have lived in [place], [place], or [place] counties for at least the last three months.   You cannot be eligible for state or federal sponsored National Cityhealthcare insurance, including CIGNAVeterans Administration, [place]icaid, or Harrah[employer] EntertainmentMedicare.   You generally cannot be eligible for healthcare insurance through your employer.    How to apply: Eligibility screenings are held every Tuesday and Wednesday afternoon from 1:00 pm until 4:00 pm. You do not need an appointment for the interview!  [facility] [address],  [place] [facility]  [phone]   °[place] [facility]  [phone]   °[place] [facility]  336-570-6415   ° °Behavioral Health Resources in the Community: °Intensive Outpatient Programs °Organization         Address  Phone  Notes  °[place] [facility] [address], [place], [place] [phone]   °[facility] [address], [place], [place] [phone]   °[facility] [address], [place], [place] ° 336-882-2125   °[facility] [address],  °[place], [place] 1-800-853-5163 or 336-641-4981   °Substance Abuse Resources °Organization         Address  Phone  Notes  °[facility]  336-882-2125   °[facility]  336-784-9470   °[facility]  336-285-9073   °[facility]  336-845-3988   °Residential & Outpatient Substance Abuse Program  1-800-659-3381   °Psychological Services °Organization         Address  Phone  Notes  °[facility]  336- [phone]   °[facility]  336- 378-7881   °[facility] [address], [place] 1-800-853-5163 or 336-641-4981   ° °Mobile Crisis Teams °Organization         Address  Phone  Notes  °[facility], Mobile Crisis Care Unit  1-877-626-1772   °Assertive °Psychotherapeutic Services ° [address]  [place], [place] 336-834-9664   °Sharon DeEsch [address] °[place] 336-554-5454   ° °Self-Help/Support Groups °Organization         Address  Phone             Notes  °[facility] [place] - variety of support groups  336- 373-1402 Call for more information  °Narcotics Anonymous (NA), [facility] [address], °[place]  2 meetings at this location  ° °Residential Treatment Programs °Organization         Address  Phone  Notes  °[facility] [address],    °[place]  1-866-801-8205   °[facility] ° [address], [place], [place] [phone]   °[facility] [address], [place] 336-845-3988 Admissions: 8am-3pm M-F  °[facility] [address]    °[place], [place] 336-841-1104   °[facility] [address], [place], [place] 336-379-7146   °[facility] [address]  °[place], [place] 336-285-9073   °[facility] - Intensive Outpatient [address], [place], [place] 336-852-3033   °[facility] [address]  °[place], [place] 1-877-615-2722 or 336-784-9470   °[facility] ([facility]) [address] [place], [place] 336-227-7417 Accepts Medicaid  °[facility] [address]  °[place] 1-800-659-3381 Substance Abuse/Addiction Treatment  ° °[place] Behavioral Health Resources °Organization         Address  Phone  Notes  °[facility]  (888) 581-9988   °Julie Brannon, PhD [address] [place], [place]   (336) 349-5553 or (336) 951-0000   °[facility]   [address] °[place], [place] (336) 349-4454   °[facility] [address], [place],  (336) 342-8316 Insurance/Medicaid/sponsorship through [facility]  °[facility] [address]                                      [place], [place] [phone] [facility] [place], [place] [phone]    Dr. Adele Schilder  [phone]   [facility] Dept. 1) [address] [place] 2) [place] 3)  [place] [address], [place] [phone]  [phone]   [place] [phone] or [phone] (After Hours)

## 2015-01-26 NOTE — ED Notes (Signed)
Interpreter in with PA.

## 2015-01-26 NOTE — ED Notes (Signed)
Pt c/o dental pain x 1 month. Pt is deaf. Interpreter has been called.

## 2015-12-14 ENCOUNTER — Emergency Department (HOSPITAL_COMMUNITY): Payer: Medicare Other

## 2015-12-14 ENCOUNTER — Emergency Department (HOSPITAL_COMMUNITY)
Admission: EM | Admit: 2015-12-14 | Discharge: 2015-12-15 | Disposition: A | Discharge status: Home / Self Care | Payer: Medicare Other | Attending: Emergency Medicine | Admitting: Emergency Medicine

## 2015-12-14 ENCOUNTER — Encounter (HOSPITAL_COMMUNITY): Payer: Self-pay | Admitting: Emergency Medicine

## 2015-12-14 DIAGNOSIS — Z79899 Other long term (current) drug therapy: Secondary | ICD-10-CM | POA: Insufficient documentation

## 2015-12-14 DIAGNOSIS — Z7984 Long term (current) use of oral hypoglycemic drugs: Secondary | ICD-10-CM | POA: Insufficient documentation

## 2015-12-14 DIAGNOSIS — R000 Tachycardia, unspecified: Secondary | ICD-10-CM | POA: Insufficient documentation

## 2015-12-14 DIAGNOSIS — A084 Viral intestinal infection, unspecified: Secondary | ICD-10-CM | POA: Insufficient documentation

## 2015-12-14 DIAGNOSIS — I10 Essential (primary) hypertension: Secondary | ICD-10-CM | POA: Insufficient documentation

## 2015-12-14 DIAGNOSIS — H9190 Unspecified hearing loss, unspecified ear: Secondary | ICD-10-CM | POA: Insufficient documentation

## 2015-12-14 DIAGNOSIS — F17210 Nicotine dependence, cigarettes, uncomplicated: Secondary | ICD-10-CM | POA: Insufficient documentation

## 2015-12-14 LAB — COMPREHENSIVE METABOLIC PANEL
ALBUMIN: 3.9 g/dL (ref 3.5–5.0)
ALT: 26 U/L (ref 14–54)
AST: 27 U/L (ref 15–41)
Alkaline Phosphatase: 78 U/L (ref 38–126)
Anion gap: 13 (ref 5–15)
BUN: 12 mg/dL (ref 6–20)
CHLORIDE: 103 mmol/L (ref 101–111)
CO2: 22 mmol/L (ref 22–32)
Calcium: 9.6 mg/dL (ref 8.9–10.3)
Creatinine, Ser: 1.09 mg/dL — ABNORMAL HIGH (ref 0.44–1.00)
GFR calc Af Amer: >60 mL/min (ref >60)
GFR, EST NON AFRICAN AMERICAN: 54 mL/min — AB (ref >60)
GLUCOSE: 113 mg/dL — AB (ref 65–99)
POTASSIUM: 3.8 mmol/L (ref 3.5–5.1)
Sodium: 138 mmol/L (ref 135–145)
Total Bilirubin: 0.6 mg/dL (ref 0.3–1.2)
Total Protein: 7.8 g/dL (ref 6.5–8.1)

## 2015-12-14 LAB — CBC
HEMATOCRIT: 38.9 % (ref 36.0–46.0)
HEMOGLOBIN: 13.2 g/dL (ref 12.0–15.0)
MCH: 32.7 pg (ref 26.0–34.0)
MCHC: 33.9 g/dL (ref 30.0–36.0)
MCV: 96.3 fL (ref 78.0–100.0)
Platelets: 320 10*3/uL (ref 150–400)
RBC: 4.04 MIL/uL (ref 3.87–5.11)
RDW: 11.9 % (ref 11.5–15.5)
WBC: 6.2 10*3/uL (ref 4.0–10.5)

## 2015-12-14 LAB — URINE MICROSCOPIC-ADD ON: RBC / HPF: NONE SEEN RBC/hpf (ref 0–5)

## 2015-12-14 LAB — URINALYSIS, ROUTINE W REFLEX MICROSCOPIC
Glucose, UA: NEGATIVE mg/dL
Hgb urine dipstick: NEGATIVE
Ketones, ur: 15 mg/dL — AB
NITRITE: NEGATIVE
PH: 5.5 (ref 5.0–8.0)
Protein, ur: NEGATIVE mg/dL
SPECIFIC GRAVITY, URINE: 1.019 (ref 1.005–1.030)

## 2015-12-14 LAB — LIPASE, BLOOD: LIPASE: 27 U/L (ref 11–51)

## 2015-12-14 MED ORDER — ONDANSETRON HCL 4 MG/2ML IJ SOLN
4.0000 mg | Freq: Once | INTRAMUSCULAR | Status: AC
  Administered 2015-12-14: 4 mg via INTRAVENOUS
  Filled 2015-12-14: qty 2

## 2015-12-14 MED ORDER — SODIUM CHLORIDE 0.9 % IV BOLUS (SEPSIS)
1000.0000 mL | Freq: Once | INTRAVENOUS | Status: AC
  Administered 2015-12-14: 1000 mL via INTRAVENOUS

## 2015-12-14 MED ORDER — DICYCLOMINE HCL 20 MG PO TABS: 20.0000 mg | ORAL_TABLET | Freq: Two times a day (BID) | ORAL | Status: DC | PRN

## 2015-12-14 MED ORDER — MORPHINE SULFATE (PF) 4 MG/ML IV SOLN
4.0000 mg | Freq: Once | INTRAVENOUS | Status: AC
  Administered 2015-12-14: 4 mg via INTRAVENOUS
  Filled 2015-12-14: qty 1

## 2015-12-14 MED ORDER — FAMOTIDINE IN NACL 20-0.9 MG/50ML-% IV SOLN
20.0000 mg | Freq: Once | INTRAVENOUS | Status: AC
  Administered 2015-12-14: 20 mg via INTRAVENOUS
  Filled 2015-12-14: qty 50

## 2015-12-14 MED ORDER — PROMETHAZINE HCL 25 MG PO TABS
25.0000 mg | ORAL_TABLET | Freq: Four times a day (QID) | ORAL | Status: DC | PRN
Start: 2015-12-14 — End: 2019-09-19

## 2015-12-14 MED ORDER — KETOROLAC TROMETHAMINE 15 MG/ML IJ SOLN
15.0000 mg | Freq: Once | INTRAMUSCULAR | Status: AC
  Administered 2015-12-14: 15 mg via INTRAVENOUS
  Filled 2015-12-14: qty 1

## 2015-12-14 NOTE — ED Provider Notes (Signed)
CSN: [id number]     Arrival date & time 12/14/15  1904 History   First MD Initiated Contact with Patient 12/14/15 2112     Chief Complaint  Patient presents with  . Emesis  . Diarrhea  . Abdominal Pain     (Consider location/radiation/quality/duration/timing/severity/associated sxs/prior Treatment) Patient is a 60 y.o. female presenting with vomiting, diarrhea, and abdominal pain. The history is provided by the patient. A language interpreter was used (sign language interpreter at bedside).  Emesis Severity:  Moderate Duration:  1 day Timing:  Sporadic Number of daily episodes:  2 Quality:  Stomach contents How soon after eating does vomiting occur:  30 minutes Chronicity:  New Recent urination:  Normal Context: not post-tussive and not self-induced   Relieved by:  Nothing Ineffective treatments: Patient took nightly metformin tablet. Associated symptoms: abdominal pain and diarrhea   Associated symptoms: no fever and no sore throat   Abdominal pain:    Location:  Epigastric   Quality:  Cramping and sharp   Severity:  Moderate   Duration:  1 day   Timing:  Constant   Progression:  Waxing and waning Diarrhea:    Quality:  Watery   Number of occurrences:  1   Severity:  Mild   Timing:  Sporadic Risk factors: diabetes and prior abdominal surgery (hysterectomy)   Risk factors: not pregnant now   Diarrhea Associated symptoms: abdominal pain and vomiting   Associated symptoms: no fever   Abdominal Pain Associated symptoms: diarrhea, nausea and vomiting   Associated symptoms: no chest pain, no dysuria, no fever, no hematuria, no shortness of breath and no sore throat     Past Medical History  Diagnosis Date  . Hypertension   . Deaf    History reviewed. No pertinent past surgical history. No family history on file. Social History  Substance Use Topics  . Smoking status: Current Every Day Smoker    Types: Cigarettes  . Smokeless tobacco: None  . Alcohol Use: Yes    OB History    No data available      Review of Systems  Constitutional: Negative for fever.  HENT: Negative for sore throat.   Respiratory: Negative for shortness of breath.   Cardiovascular: Negative for chest pain.  Gastrointestinal: Positive for nausea, vomiting, abdominal pain and diarrhea.  Genitourinary: Negative for dysuria and hematuria.  All other systems reviewed and are negative.   Allergies  Review of patient's allergies indicates no known allergies.  Home Medications   Prior to Admission medications   Medication Sig Start Date End Date Taking? Authorizing Provider  acetaminophen (TYLENOL) 325 MG tablet Take 325 mg by mouth every 6 (six) hours as needed for moderate pain.   Yes Historical Provider, MD  metFORMIN (GLUCOPHAGE) 500 MG tablet Take 500 mg by mouth 2 (two) times daily with a meal.   Yes Historical Provider, MD  Multiple Vitamin (MULTIVITAMIN WITH MINERALS) TABS tablet Take 1 tablet by mouth daily.   Yes Historical Provider, MD   BP 129/75 mmHg  Pulse 70  Temp(Src) 98.4 F (36.9 C) (Oral)  Resp 16  Wt 98.913 kg  SpO2 99%   Physical Exam  Constitutional: She is oriented to person, place, and time. She appears well-developed and well-nourished. No distress.  Nontoxic/nonseptic appearing  HENT:  Head: Normocephalic and atraumatic.  Eyes: Conjunctivae and EOM are normal. No scleral icterus.  Neck: Normal range of motion.  Cardiovascular: Regular rhythm and intact distal pulses.   Mild tachycardia  Pulmonary/Chest:  Effort normal and breath sounds normal. No respiratory distress. She has no wheezes. She has no rales.  Respirations even and unlabored  Abdominal: Soft. She exhibits no distension. There is tenderness. There is no rebound and no guarding.  Epigastric TTP, mild. No masses or peritoneal signs. Abdomen soft.  Musculoskeletal: Normal range of motion.  Neurological: She is alert and oriented to person, place, and time. She exhibits normal  muscle tone. Coordination normal.  Skin: Skin is warm and dry. No rash noted. She is not diaphoretic. No erythema. No pallor.  Psychiatric: She has a normal mood and affect. Her behavior is normal.  Nursing note and vitals reviewed.   ED Course  Procedures (including critical care time) Labs Review Labs Reviewed  COMPREHENSIVE METABOLIC PANEL - Abnormal; Notable for the following:    Glucose, Bld 113 (*)    Creatinine, Ser 1.09 (*)    GFR calc non Af Amer 54 (*)    All other components within normal limits  URINALYSIS, ROUTINE W REFLEX MICROSCOPIC (NOT AT [facility]) - Abnormal; Notable for the following:    APPearance CLOUDY (*)    Bilirubin Urine SMALL (*)    Ketones, ur 15 (*)    Leukocytes, UA SMALL (*)    All other components within normal limits  URINE MICROSCOPIC-ADD ON - Abnormal; Notable for the following:    Squamous Epithelial / LPF 0-5 (*)    Bacteria, UA MANY (*)    All other components within normal limits  LIPASE, BLOOD  CBC    Imaging Review US Abdomen Limited  12/14/2015  CLINICAL DATA:  Right upper quadrant pain today. EXAM: US ABDOMEN LIMITED - RIGHT UPPER QUADRANT COMPARISON:  CT abdomen pelvis 11/19/2014. FINDINGS: Gallbladder: Surgically absent. Common bile duct: Diameter: 1.6 cm, stable. Liver: Echogenicity appears somewhat increased.  No focal lesion. IMPRESSION: Liver may be slightly fatty.  No acute findings. Electronically Signed   By: Leanna Battles M.D.   On: 12/14/2015 23:17     I have personally reviewed and evaluated these images and lab results as part of my medical decision-making.   EKG Interpretation None      10:24 PM Patient reports some slight improvement in her pain. She does complain of persistent epigastric pain. On reexamination of the abdomen there is tenderness in the epigastric abdomen and right upper quadrant. There is still no Murphy's sign. Will, however, obtain an ultrasound to evaluate for gallbladder etiology. Patient for acute  cholecystitis given lack of leukocytosis and elevated LFTs.  11:20 PM [place] reviewed. Negative for gallbladder etiology.  11:50 PM Ultrasound findings reviewed with the patient at bedside. Her abdominal reexamination is stable to improved. She states that she is feeling much better than when she arrived. Her vital signs are stable and laboratory workup is noncontributory. Symptoms likely secondary to food related illness or viral etiology. Will discharge with supportive care instructions. Patient agreeable to plan with no unaddressed concerns.  MDM   Final diagnoses:  Viral gastroenteritis    Patient with symptoms consistent with gastroenteritis, viral vs food borne. Vitals are stable, no fever. Lungs are clear. Doubt appendicitis, cholecystitis, pancreatitis, ruptured viscus, UTI, kidney stone, or any other emergent or surgical abdominal etiology. Supportive therapy indicated with return if symptoms worsen. Patient discharged in satisfactory condition.   Filed Vitals:   12/14/15 1916 12/14/15 2131 12/14/15 2200  BP: 140/88 132/82 129/75  Pulse: 105 76 70  Temp: 98.4 F (36.9 C)    TempSrc: Oral    Resp:  Weight: 98.913 kg    SpO2: 95% 98% 99%     Antony Madura, PA-C 12/14/15 2352  Vanetta Mulders, MD 12/20/15 0000

## 2015-12-14 NOTE — ED Notes (Addendum)
Pt. reports emesis with diarrhea onset today with generalized abdominal pain , pt. suspects food poisoning stated " ate wrong food" .

## 2015-12-14 NOTE — ED Notes (Signed)
Pt unable to void at this time. 

## 2015-12-14 NOTE — Discharge Instructions (Signed)

## 2017-03-18 ENCOUNTER — Emergency Department (HOSPITAL_COMMUNITY): Payer: Medicare Other

## 2017-03-18 ENCOUNTER — Encounter (HOSPITAL_COMMUNITY): Payer: Self-pay

## 2017-03-18 DIAGNOSIS — I10 Essential (primary) hypertension: Secondary | ICD-10-CM | POA: Insufficient documentation

## 2017-03-18 DIAGNOSIS — E119 Type 2 diabetes mellitus without complications: Secondary | ICD-10-CM | POA: Insufficient documentation

## 2017-03-18 DIAGNOSIS — J209 Acute bronchitis, unspecified: Secondary | ICD-10-CM | POA: Insufficient documentation

## 2017-03-18 DIAGNOSIS — H913 Deaf nonspeaking, not elsewhere classified: Secondary | ICD-10-CM | POA: Insufficient documentation

## 2017-03-18 DIAGNOSIS — F17210 Nicotine dependence, cigarettes, uncomplicated: Secondary | ICD-10-CM | POA: Insufficient documentation

## 2017-03-18 LAB — BASIC METABOLIC PANEL
Anion gap: 9 (ref 5–15)
BUN: 17 mg/dL (ref 6–20)
CHLORIDE: 105 mmol/L (ref 101–111)
CO2: 22 mmol/L (ref 22–32)
CREATININE: 0.97 mg/dL (ref 0.44–1.00)
Calcium: 8.9 mg/dL (ref 8.9–10.3)
Glucose, Bld: 118 mg/dL — ABNORMAL HIGH (ref 65–99)
Potassium: 3.6 mmol/L (ref 3.5–5.1)
SODIUM: 136 mmol/L (ref 135–145)

## 2017-03-18 LAB — CBC
HCT: 35.6 % — ABNORMAL LOW (ref 36.0–46.0)
Hemoglobin: 12.3 g/dL (ref 12.0–15.0)
MCH: 33.3 pg (ref 26.0–34.0)
MCHC: 34.6 g/dL (ref 30.0–36.0)
MCV: 96.5 fL (ref 78.0–100.0)
PLATELETS: 248 10*3/uL (ref 150–400)
RBC: 3.69 MIL/uL — AB (ref 3.87–5.11)
RDW: 12.1 % (ref 11.5–15.5)
WBC: 4.7 10*3/uL (ref 4.0–10.5)

## 2017-03-18 LAB — CBG MONITORING, ED: GLUCOSE-CAPILLARY: 117 mg/dL — AB (ref 65–99)

## 2017-03-18 LAB — I-STAT TROPONIN, ED: TROPONIN I, POC: 0 ng/mL (ref 0.00–0.08)

## 2017-03-18 NOTE — ED Notes (Signed)
Checked CBG 117, RN Susanna informed

## 2017-03-18 NOTE — ED Triage Notes (Signed)
Patient is deaf,  Uses sign language and can read lips some.  Reports chest pain across whole chest that started at the beginning of the month along with a productive cough. Also report having a fever.  Afebrile in triage.

## 2017-03-19 ENCOUNTER — Emergency Department (HOSPITAL_COMMUNITY)
Admission: EM | Admit: 2017-03-19 | Discharge: 2017-03-19 | Disposition: A | Discharge status: Home / Self Care | Payer: Medicare Other | Attending: Emergency Medicine | Admitting: Emergency Medicine

## 2017-03-19 DIAGNOSIS — J40 Bronchitis, not specified as acute or chronic: Secondary | ICD-10-CM

## 2017-03-19 LAB — I-STAT TROPONIN, ED: TROPONIN I, POC: 0.01 ng/mL (ref 0.00–0.08)

## 2017-03-19 MED ORDER — DOXYCYCLINE HYCLATE 100 MG PO CAPS: 100.0000 mg | ORAL_CAPSULE | Freq: Two times a day (BID) | ORAL | 0 refills | Status: DC

## 2017-03-19 MED ORDER — BENZONATATE 100 MG PO CAPS: 100.0000 mg | ORAL_CAPSULE | Freq: Three times a day (TID) | ORAL | 0 refills | Status: DC

## 2017-03-19 MED ORDER — IPRATROPIUM-ALBUTEROL 0.5-2.5 (3) MG/3ML IN SOLN
3.0000 mL | Freq: Once | RESPIRATORY_TRACT | Status: AC
  Administered 2017-03-19: 3 mL via RESPIRATORY_TRACT
  Filled 2017-03-19: qty 3

## 2017-03-19 MED ORDER — PREDNISONE 50 MG PO TABS: ORAL_TABLET | ORAL | 0 refills | Status: DC

## 2017-03-19 MED ORDER — ALBUTEROL SULFATE HFA 108 (90 BASE) MCG/ACT IN AERS: 1.0000 | INHALATION_SPRAY | Freq: Four times a day (QID) | RESPIRATORY_TRACT | 0 refills | Status: DC | PRN

## 2017-03-19 MED ORDER — PREDNISONE 20 MG PO TABS
60.0000 mg | ORAL_TABLET | Freq: Once | ORAL | Status: AC
  Administered 2017-03-19: 60 mg via ORAL
  Filled 2017-03-19: qty 3

## 2017-03-19 NOTE — ED Provider Notes (Signed)
[facility]-EMERGENCY DEPT Provider Note   CSN: [id number] Arrival date & time: 03/18/17  2125  By signing my name below, I, Katelyn Winters, attest that this documentation has been prepared under the direction and in the presence of [name], Jeannett Senior, MD. Electronically Signed: Rosario Winters, ED Scribe. 03/19/17. 1:29 AM.  History   Chief Complaint Chief Complaint  Patient presents with  . Chest Pain   LEVEL V CAVEAT: HPI and ROS limited due to language barrier  The history is provided by the patient. The history is limited by a language barrier. A language interpreter was used (ASL).    HPI Comments: Katelyn Winters is a 61 y.o. female with a h/o deafness DM, and HTN, who presents to the Emergency Department complaining of persistent, worsening productive cough of green sputum beginning approximately 18 days ago. She notes associated congestion, fever, frontal headache, sore throat, chest soreness 2/t coughing. No chest pain at preset. Additionally, she has been keeping track of her CBG at home and her sugar has been running at 225. Her chest pain is worse with cough. No noted treatments for her symptoms were tried prior to coming into the ED. She is a current, everyday smoker. No h/o asthma or COPD. She denies vomiting, diarrhea, or any other associated symptoms.   Past Medical History:  Diagnosis Date  . Deaf   . Hypertension    There are no active problems to display for this patient.  History reviewed. No pertinent surgical history.  OB History    No data available     Home Medications    Prior to Admission medications   Medication Sig Start Date End Date Taking? Authorizing Provider  acetaminophen (TYLENOL) 325 MG tablet Take 325 mg by mouth every 6 (six) hours as needed for moderate pain.    [provider]  dicyclomine (BENTYL) 20 MG tablet Take 1 tablet (20 mg total) by mouth 2 (two) times daily as needed (for abdominal cramping). 12/14/15   Antony Madura,  PA-C  metFORMIN (GLUCOPHAGE) 500 MG tablet Take 500 mg by mouth 2 (two) times daily with a meal.    [provider]  Multiple Vitamin (MULTIVITAMIN WITH MINERALS) TABS tablet Take 1 tablet by mouth daily.    [provider]  promethazine (PHENERGAN) 25 MG tablet Take 1 tablet (25 mg total) by mouth every 6 (six) hours as needed for nausea or vomiting. 12/14/15   Antony Madura, PA-C   Family History No family history on file.  Social History Social History  Substance Use Topics  . Smoking status: Current Every Day Smoker    Types: Cigarettes  . Smokeless tobacco: Never Used  . Alcohol use Yes   Allergies   Patient has no known allergies.  Review of Systems Review of Systems  Unable to perform ROS: Other (language barrier)   Physical Exam Updated Vital Signs BP 131/78 (BP Location: Right Arm)   Pulse 72   Temp 97.6 F (36.4 C) (Oral)   Resp 18   Ht 5\' 6"  (1.676 m)   Wt 218 lb (98.9 kg)   SpO2 100%   BMI 35.19 kg/m   Physical Exam  Constitutional: She is oriented to person, place, and time. She appears well-developed and well-nourished. No distress.  HENT:  Head: Normocephalic and atraumatic.  Right Ear: Tympanic membrane, external ear and ear canal normal.  Left Ear: Tympanic membrane, external ear and ear canal normal.  Mouth/Throat: Posterior oropharyngeal erythema present. No oropharyngeal exudate.  Mild erythema  of the PO.   Eyes: Pupils are equal, round, and reactive to light. Conjunctivae and EOM are normal.  Neck: Normal range of motion. Neck supple.  No meningismus.  Cardiovascular: Normal rate, regular rhythm, normal heart sounds and intact distal pulses.   No murmur heard. Pulmonary/Chest: Effort normal. No respiratory distress. She has wheezes. She has no rales.  Good air exchange with scattered wheezing.   Abdominal: Soft. There is no tenderness. There is no rebound and no guarding.  Musculoskeletal: Normal range of motion. She exhibits no  edema or tenderness.  No peripheral edema.   Neurological: She is alert and oriented to person, place, and time. No cranial nerve deficit. She exhibits normal muscle tone. Coordination normal.   5/5 strength throughout. CN 2-12 intact.Equal grip strength.   Skin: Skin is warm.  Psychiatric: She has a normal mood and affect. Her behavior is normal.  Nursing note and vitals reviewed.  ED Treatments / Results  DIAGNOSTIC STUDIES: Oxygen Saturation is 100% on RA, normal by my interpretation.   COORDINATION OF CARE: 1:29 AM-Discussed next steps with pt. Pt verbalized understanding and is agreeable with the plan.   Labs (all labs ordered are listed, but only abnormal results are displayed) Labs Reviewed  BASIC METABOLIC PANEL - Abnormal; Notable for the following:       Result Value   Glucose, Bld 118 (*)    All other components within normal limits  CBC - Abnormal; Notable for the following:    RBC 3.69 (*)    HCT 35.6 (*)    All other components within normal limits  CBG MONITORING, ED - Abnormal; Notable for the following:    Glucose-Capillary 117 (*)    All other components within normal limits  RAPID STREP SCREEN (NOT [facility])  I-STAT TROPONIN, ED  I-STAT TROPONIN, ED   EKG  EKG Interpretation  Date/Time:  Wednesday March 18 2017 21:33:25 EDT Ventricular Rate:  77 PR Interval:  148 QRS Duration: 70 QT Interval:  364 QTC Calculation: 411 R Axis:   49 Text Interpretation:  Normal sinus rhythm Normal ECG No previous ECGs available Confirmed by Glynn Octaveancour, Zimere Dunlevy [phone]([id number]) on 03/19/2017 12:37:31 AM      Radiology Dg Chest 2 View  Result Date: 03/18/2017 CLINICAL DATA:  Chest pain EXAM: CHEST  2 VIEW COMPARISON:  None. FINDINGS: The heart size and mediastinal contours are within normal limits. Both lungs are clear. The visualized skeletal structures are unremarkable. IMPRESSION: No active cardiopulmonary disease. Electronically Signed   By: Deatra RobinsonKevin  Herman M.D.   On: 03/18/2017  22:18   Procedures Procedures  Medications Ordered in ED Medications - No data to display  Initial Impression / Assessment and Plan / ED Course  I have reviewed the triage vital signs and the nursing notes.  Pertinent labs & imaging results that were available during my care of the patient were reviewed by me and considered in my medical decision making (see chart for details).     Patient presents with 1 month of chest congestion, cough and shortness of breath. Denies history of asthma or COPD but is a smoker.  Patient with some scattered wheezing on exam. She is given nebulizers and steroids. Chest x-ray negative. EKG is nonischemic. LOw suspicion for ACS. Her chest tightness appears to be due to coughing.  Troponin negative 2. Ambulatory without desaturation.  Doubt ACS, doubt PE.   Treat for bronchitis. Smoking cessation encouraged. PCP followup. Return precautions discussed.   Final Clinical Impressions(s) /  ED Diagnoses   Final diagnoses:  Bronchitis   New Prescriptions New Prescriptions   No medications on file  I personally performed the services described in this documentation, which was scribed in my presence. The recorded information has been reviewed and is accurate.    Glynn Octave, MD 03/19/17 [phone]

## 2017-03-19 NOTE — Discharge Instructions (Signed)
Take the medications as prescribed. Stop smoking. Followup with your doctor. Return to the ED if you develop new or worsening symptoms.

## 2017-03-19 NOTE — ED Notes (Addendum)
Ambulated with patient, sats remained >94% on RA.

## 2017-06-12 ENCOUNTER — Encounter (HOSPITAL_COMMUNITY): Payer: Self-pay | Admitting: Emergency Medicine

## 2017-06-12 ENCOUNTER — Emergency Department (HOSPITAL_COMMUNITY): Payer: Self-pay

## 2017-06-12 ENCOUNTER — Emergency Department (HOSPITAL_COMMUNITY)
Admission: EM | Admit: 2017-06-12 | Discharge: 2017-06-12 | Disposition: A | Discharge status: Home / Self Care | Payer: Self-pay | Attending: Emergency Medicine | Admitting: Emergency Medicine

## 2017-06-12 DIAGNOSIS — Z7984 Long term (current) use of oral hypoglycemic drugs: Secondary | ICD-10-CM | POA: Insufficient documentation

## 2017-06-12 DIAGNOSIS — M546 Pain in thoracic spine: Secondary | ICD-10-CM | POA: Insufficient documentation

## 2017-06-12 DIAGNOSIS — Z79899 Other long term (current) drug therapy: Secondary | ICD-10-CM | POA: Insufficient documentation

## 2017-06-12 DIAGNOSIS — E119 Type 2 diabetes mellitus without complications: Secondary | ICD-10-CM | POA: Insufficient documentation

## 2017-06-12 DIAGNOSIS — I10 Essential (primary) hypertension: Secondary | ICD-10-CM | POA: Insufficient documentation

## 2017-06-12 DIAGNOSIS — F17210 Nicotine dependence, cigarettes, uncomplicated: Secondary | ICD-10-CM | POA: Insufficient documentation

## 2017-06-12 HISTORY — DX: Type 2 diabetes mellitus without complications: E11.9

## 2017-06-12 LAB — CBG MONITORING, ED: GLUCOSE-CAPILLARY: 95 mg/dL (ref 65–99)

## 2017-06-12 MED ORDER — HYDROCODONE-ACETAMINOPHEN 5-325 MG PO TABS
1.0000 | ORAL_TABLET | Freq: Once | ORAL | Status: AC
  Administered 2017-06-12: 1 via ORAL
  Filled 2017-06-12: qty 1

## 2017-06-12 MED ORDER — CYCLOBENZAPRINE HCL 10 MG PO TABS: 10.0000 mg | ORAL_TABLET | Freq: Every evening | ORAL | 0 refills | Status: DC | PRN

## 2017-06-12 MED ORDER — HYDROCODONE-ACETAMINOPHEN 5-325 MG PO TABS: 1.0000 | ORAL_TABLET | Freq: Four times a day (QID) | ORAL | 0 refills | Status: DC | PRN

## 2017-06-12 NOTE — Discharge Instructions (Signed)
Please read instructions below. Apply ice to your back for 20 minutes at a time. You can also apply heat. Avoid heavy lifting until your symptoms resolve. You can take hydrocodone every 6 hours as needed for severe pain. Not drive, drink alcohol, or take Tylenol with this medication. You can take Advil/ibuprofen or Aleve with hydrocodone for added pain relief. You can also take advil/ibuprofen or aleve alone for mild to moderate pain. You can take flexeril at bedtime as needed for muscle spasm. This medication can make you drowsy so take at bedtime. Follow up with your primary care provider when you return home if symptoms persist. Return to ER if new numbness or tingling in your arms or legs, inability to urinate, inability to hold your bowels, or weakness in your extremities.

## 2017-06-12 NOTE — ED Triage Notes (Signed)
Pt arrives with several day hx of right upper back pain. Pt states pain started after heavy lifting this week while at work. Pt ambulatory, denies numbness, tingling, falls, recent injury. VSS.

## 2017-06-12 NOTE — ED Provider Notes (Signed)
[facility]PT Provider Note   CSN: [id number] Arrival date & time: 06/12/17  1153     History   Chief Complaint Chief Complaint  Patient presents with  . Back Pain    HPI Katelyn Winters is a 61 y.o. female past medical history of chronic deafness, hypertension, type 2 diabetes, presenting with to the ED with acute onset of persistent right upper back pain since Tuesday. She states she did some heavy lifting at work and felt immediate pain. Pain is worse with movement, not relieved with tylenol. No previous injuries. Denies numbness or tingling down extremities, bowel or bladder incontinence, fever, abdominal pain, or other complaints. Denies history of cancer or IV drug use.  The history is provided by the patient. The history is limited by a language barrier (Condition definitely able to communicate via sign language. Unable to contact interpretor through [employer]. Communication was done via writing, with full history and all questions answered.).    Past Medical History:  Diagnosis Date  . Deaf   . Diabetes mellitus without complication (HCC)   . Hypertension     There are no active problems to display for this patient.   No past surgical history on file.  OB History    No data available       Home Medications    Prior to Admission medications   Medication Sig Start Date End Date Taking? Authorizing Provider  acetaminophen (TYLENOL) 325 MG tablet Take 325 mg by mouth every 6 (six) hours as needed for moderate pain.    [provider]  albuterol (PROVENTIL HFA;VENTOLIN HFA) 108 (90 Base) MCG/ACT inhaler Inhale 1-2 puffs into the lungs every 6 (six) hours as needed for wheezing or shortness of breath. 03/19/17   Rancour, Jeannett Senior, MD  benzonatate (TESSALON) 100 MG capsule Take 1 capsule (100 mg total) by mouth every 8 (eight) hours. 03/19/17   Rancour, Jeannett Senior, MD  cyclobenzaprine (FLEXERIL) 10 MG tablet Take 1 tablet (10 mg total) by mouth at bedtime as  needed. 06/12/17   Russo, [place] N, PA-C  dicyclomine (BENTYL) 20 MG tablet Take 1 tablet (20 mg total) by mouth 2 (two) times daily as needed (for abdominal cramping). 12/14/15   Antony Madura, PA-C  doxycycline (VIBRAMYCIN) 100 MG capsule Take 1 capsule (100 mg total) by mouth 2 (two) times daily. 03/19/17   Rancour, Jeannett Senior, MD  HYDROcodone-acetaminophen (NORCO/VICODIN) 5-325 MG tablet Take 1-2 tablets by mouth every 6 (six) hours as needed for severe pain. 06/12/17   Russo, [place] N, PA-C  metFORMIN (GLUCOPHAGE) 500 MG tablet Take 500 mg by mouth 2 (two) times daily with a meal.    [provider]  Multiple Vitamin (MULTIVITAMIN WITH MINERALS) TABS tablet Take 1 tablet by mouth daily.    [provider]  predniSONE (DELTASONE) 50 MG tablet 1 tablet PO daily 03/19/17   Rancour, Jeannett Senior, MD  promethazine (PHENERGAN) 25 MG tablet Take 1 tablet (25 mg total) by mouth every 6 (six) hours as needed for nausea or vomiting. 12/14/15   Antony Madura, PA-C    Family History No family history on file.  Social History Social History  Substance Use Topics  . Smoking status: Current Every Day Smoker    Types: Cigarettes  . Smokeless tobacco: Never Used  . Alcohol use Yes     Allergies   Patient has no known allergies.   Review of Systems Review of Systems  Constitutional: Negative for fever.  Gastrointestinal: Negative for abdominal pain.  Genitourinary: Negative  for dysuria and flank pain.  Musculoskeletal: Positive for back pain. Negative for neck pain.  Neurological: Negative for weakness and numbness.     Physical Exam Updated Vital Signs BP 125/85 (BP Location: Right Arm)   Pulse 80   Temp 97.9 F (36.6 C) (Oral)   Resp 18   SpO2 100%   Physical Exam  Constitutional: She appears well-developed and well-nourished. No distress.  HENT:  Head: Normocephalic and atraumatic.  Eyes: Conjunctivae are normal.  Neck: Normal range of motion. Neck supple.    Cardiovascular: Normal rate and intact distal pulses.   Pulmonary/Chest: Effort normal and breath sounds normal. She exhibits no tenderness.  Musculoskeletal:  No  Midline spinal tenderness, no bony step-offs, no gross deformities. TTP over base of right trapezius and just medial to scapula. Neck, back and shoulders with Nl ROM.   Neurological:  Motor:  Normal tone. 5/5 in upper and lower extremities bilaterally including strong and equal grip strength and dorsiflexion/plantar flexion Sensory: Pinprick and light touch normal in all extremities.  Deep Tendon Reflexes: 2+ and symmetric in the biceps and patella Gait: normal gait and balance CV: distal pulses palpable throughout    Psychiatric: She has a normal mood and affect. Her behavior is normal.  Nursing note and vitals reviewed.    ED Treatments / Results  Labs (all labs ordered are listed, but only abnormal results are displayed) Labs Reviewed  CBG MONITORING, ED    EKG  EKG Interpretation None       Radiology Dg Thoracic Spine 2 View  Result Date: 06/12/2017 CLINICAL DATA:  Pt arrives with several day hx of right upper back pain. Pt states pain started after heavy lifting this week while at work. Pt ambulatory, denies numbness, tingling, falls, recent injury. EXAM: THORACIC SPINE 2 VIEWS COMPARISON:  03/18/2017 FINDINGS: No fracture.  No spondylolisthesis.  No bone lesion. Mild loss of disc height with endplate spurring is noted throughout the thoracic spine, stable from the prior chest radiographs. Soft tissues are unremarkable. IMPRESSION: 1. No fracture, spondylolisthesis or acute finding. 2. 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 Electronically Signed   By: Amie Portland M.D.   On: 06/12/2017 12:46    Procedures Procedures (including critical care time)  Medications Ordered in ED Medications  HYDROcodone-acetaminophen (NORCO/VICODIN) 5-325 MG per tablet 1 tablet (1 tablet Oral Given  06/12/17 1417)     Initial Impression / Assessment and Plan / ED Course  I have reviewed the triage vital signs and the nursing notes.  Pertinent labs & imaging results that were available during my care of the patient were reviewed by me and considered in my medical decision making (see chart for details).     Patient with back pain, suspect muscle strain. T-spine x-ray done in triage, without acute pathology. No neurological deficits and normal neuro exam.  Noraml gait. No loss of bowel or bladder control.  No concern for cauda equina.  No fever, night sweats, weight loss, h/o cancer, IVDU.  RICE protocol and pain medicine indicated and discussed with patient.   Discussed results, findings, treatment and follow up. Patient advised of return precautions. Patient verbalized understanding and agreed with plan.  Final Clinical Impressions(s) / ED Diagnoses   Final diagnoses:  Acute right-sided thoracic back pain    New Prescriptions Discharge Medication List as of 06/12/2017  2:14 PM    START taking these medications   Details  cyclobenzaprine (FLEXERIL) 10 MG tablet Take 1 tablet (10 mg  total) by mouth at bedtime as needed., Starting Fri 06/12/2017, Print    HYDROcodone-acetaminophen (NORCO/VICODIN) 5-325 MG tablet Take 1-2 tablets by mouth every 6 (six) hours as needed for severe pain., Starting Fri 06/12/2017, Print         Russo, [place] N, PA-C 06/12/17 1703    Donnetta Hutching, MD 06/13/17 [phone]

## 2017-12-23 ENCOUNTER — Emergency Department (HOSPITAL_COMMUNITY)
Admission: EM | Admit: 2017-12-23 | Discharge: 2017-12-23 | Disposition: A | Discharge status: Home / Self Care | Payer: Self-pay | Attending: Emergency Medicine | Admitting: Emergency Medicine

## 2017-12-23 ENCOUNTER — Other Ambulatory Visit: Payer: Self-pay

## 2017-12-23 ENCOUNTER — Emergency Department (HOSPITAL_COMMUNITY): Payer: Self-pay

## 2017-12-23 ENCOUNTER — Encounter (HOSPITAL_COMMUNITY): Payer: Self-pay

## 2017-12-23 DIAGNOSIS — E119 Type 2 diabetes mellitus without complications: Secondary | ICD-10-CM | POA: Insufficient documentation

## 2017-12-23 DIAGNOSIS — Y929 Unspecified place or not applicable: Secondary | ICD-10-CM | POA: Insufficient documentation

## 2017-12-23 DIAGNOSIS — W010XXA Fall on same level from slipping, tripping and stumbling without subsequent striking against object, initial encounter: Secondary | ICD-10-CM | POA: Insufficient documentation

## 2017-12-23 DIAGNOSIS — Z7984 Long term (current) use of oral hypoglycemic drugs: Secondary | ICD-10-CM | POA: Insufficient documentation

## 2017-12-23 DIAGNOSIS — Y990 Civilian activity done for income or pay: Secondary | ICD-10-CM | POA: Insufficient documentation

## 2017-12-23 DIAGNOSIS — F17210 Nicotine dependence, cigarettes, uncomplicated: Secondary | ICD-10-CM | POA: Insufficient documentation

## 2017-12-23 DIAGNOSIS — W19XXXA Unspecified fall, initial encounter: Secondary | ICD-10-CM

## 2017-12-23 DIAGNOSIS — R42 Dizziness and giddiness: Secondary | ICD-10-CM

## 2017-12-23 DIAGNOSIS — I10 Essential (primary) hypertension: Secondary | ICD-10-CM | POA: Insufficient documentation

## 2017-12-23 DIAGNOSIS — Y939 Activity, unspecified: Secondary | ICD-10-CM | POA: Insufficient documentation

## 2017-12-23 DIAGNOSIS — S40011A Contusion of right shoulder, initial encounter: Secondary | ICD-10-CM | POA: Insufficient documentation

## 2017-12-23 DIAGNOSIS — Z79899 Other long term (current) drug therapy: Secondary | ICD-10-CM | POA: Insufficient documentation

## 2017-12-23 LAB — I-STAT TROPONIN, ED: TROPONIN I, POC: 0 ng/mL (ref 0.00–0.08)

## 2017-12-23 LAB — URINALYSIS, ROUTINE W REFLEX MICROSCOPIC
BACTERIA UA: NONE SEEN
BILIRUBIN URINE: NEGATIVE
Glucose, UA: NEGATIVE mg/dL
Hgb urine dipstick: NEGATIVE
KETONES UR: NEGATIVE mg/dL
NITRITE: NEGATIVE
PH: 6 (ref 5.0–8.0)
Protein, ur: NEGATIVE mg/dL
Specific Gravity, Urine: 1.014 (ref 1.005–1.030)

## 2017-12-23 LAB — CBC WITH DIFFERENTIAL/PLATELET
Basophils Absolute: 0 10*3/uL (ref 0.0–0.1)
Basophils Relative: 1 %
Eosinophils Absolute: 0.2 10*3/uL (ref 0.0–0.7)
Eosinophils Relative: 5 %
HEMATOCRIT: 41 % (ref 36.0–46.0)
HEMOGLOBIN: 14.1 g/dL (ref 12.0–15.0)
LYMPHS PCT: 38 %
Lymphs Abs: 1.9 10*3/uL (ref 0.7–4.0)
MCH: 33.7 pg (ref 26.0–34.0)
MCHC: 34.4 g/dL (ref 30.0–36.0)
MCV: 98.1 fL (ref 78.0–100.0)
Monocytes Absolute: 0.3 10*3/uL (ref 0.1–1.0)
Monocytes Relative: 6 %
NEUTROS PCT: 50 %
Neutro Abs: 2.6 10*3/uL (ref 1.7–7.7)
Platelets: 328 10*3/uL (ref 150–400)
RBC: 4.18 MIL/uL (ref 3.87–5.11)
RDW: 11.9 % (ref 11.5–15.5)
WBC: 5 10*3/uL (ref 4.0–10.5)

## 2017-12-23 LAB — BASIC METABOLIC PANEL
ANION GAP: 10 (ref 5–15)
BUN: 8 mg/dL (ref 6–20)
CHLORIDE: 102 mmol/L (ref 101–111)
CO2: 25 mmol/L (ref 22–32)
Calcium: 9.3 mg/dL (ref 8.9–10.3)
Creatinine, Ser: 0.93 mg/dL (ref 0.44–1.00)
GFR calc non Af Amer: >60 mL/min (ref >60)
Glucose, Bld: 88 mg/dL (ref 65–99)
POTASSIUM: 4.1 mmol/L (ref 3.5–5.1)
Sodium: 137 mmol/L (ref 135–145)

## 2017-12-23 LAB — CBG MONITORING, ED: GLUCOSE-CAPILLARY: 113 mg/dL — AB (ref 65–99)

## 2017-12-23 NOTE — Discharge Instructions (Signed)
Drink plenty of fluids. Eat well balanced meals. No lifting greater than 5 lb with right arm for 1 week. Ibuprofen for pain. Ice several times a day. Follow up with a family doctor.

## 2017-12-23 NOTE — ED Provider Notes (Signed)
[facility] EMERGENCY DEPARTMENT Provider Note   CSN: [id number] Arrival date & time: 12/23/17  1142     History   Chief Complaint Chief Complaint  Patient presents with  . Fall    HPI Katelyn Winters is a 62 y.o. female.  HPI Katelyn Winters is a 62 y.o. female with history of hypertension and diabetes, presents to emergency complaining of dizziness and a fall.  Patient states she was at work, states she became dizzy, fell hitting her right shoulder on the ground.  She denies hitting her head or losing consciousness.  She denies shortness of breath or chest pain during the fall.  She states that she thought she got dizzy because she is diabetic.  She admits to taking her medicine and eating breakfast this morning.  She has not had lunch.  She states she is no longer dizzy.  She states that she is having severe pain in her right shoulder and chest wall since the fall.  She is able to ambulate with no difficulty.  No treatment prior to coming in.  Past Medical History:  Diagnosis Date  . Deaf   . Diabetes mellitus without complication (HCC)   . Hypertension     There are no active problems to display for this patient.   History reviewed. No pertinent surgical history.   OB History   None      Home Medications    Prior to Admission medications   Medication Sig Start Date End Date Taking? Authorizing Provider  acetaminophen (TYLENOL) 325 MG tablet Take 325 mg by mouth every 6 (six) hours as needed for moderate pain.    [provider]  albuterol (PROVENTIL HFA;VENTOLIN HFA) 108 (90 Base) MCG/ACT inhaler Inhale 1-2 puffs into the lungs every 6 (six) hours as needed for wheezing or shortness of breath. 03/19/17   Rancour, Jeannett Senior, MD  benzonatate (TESSALON) 100 MG capsule Take 1 capsule (100 mg total) by mouth every 8 (eight) hours. 03/19/17   Rancour, Jeannett Senior, MD  cyclobenzaprine (FLEXERIL) 10 MG tablet Take 1 tablet (10 mg total) by mouth at bedtime  as needed. 06/12/17   Robinson, [place] N, PA-C  dicyclomine (BENTYL) 20 MG tablet Take 1 tablet (20 mg total) by mouth 2 (two) times daily as needed (for abdominal cramping). 12/14/15   Antony Madura, PA-C  doxycycline (VIBRAMYCIN) 100 MG capsule Take 1 capsule (100 mg total) by mouth 2 (two) times daily. 03/19/17   Rancour, Jeannett Senior, MD  HYDROcodone-acetaminophen (NORCO/VICODIN) 5-325 MG tablet Take 1-2 tablets by mouth every 6 (six) hours as needed for severe pain. 06/12/17   Robinson, [place] N, PA-C  metFORMIN (GLUCOPHAGE) 500 MG tablet Take 500 mg by mouth 2 (two) times daily with a meal.    [provider]  Multiple Vitamin (MULTIVITAMIN WITH MINERALS) TABS tablet Take 1 tablet by mouth daily.    [provider]  predniSONE (DELTASONE) 50 MG tablet 1 tablet PO daily 03/19/17   Rancour, Jeannett Senior, MD  promethazine (PHENERGAN) 25 MG tablet Take 1 tablet (25 mg total) by mouth every 6 (six) hours as needed for nausea or vomiting. 12/14/15   Antony Madura, PA-C    Family History No family history on file.  Social History Social History   Tobacco Use  . Smoking status: Current Every Day Smoker    Types: Cigarettes  . Smokeless tobacco: Never Used  Substance Use Topics  . Alcohol use: Yes  . Drug use: No     Allergies  Patient has no known allergies.   Review of Systems Review of Systems  Constitutional: Negative for chills and fever.  Respiratory: Positive for chest tightness. Negative for cough and shortness of breath.   Cardiovascular: Negative for chest pain, palpitations and leg swelling.  Gastrointestinal: Negative for abdominal pain, diarrhea, nausea and vomiting.  Genitourinary: Negative for dysuria, flank pain and pelvic pain.  Musculoskeletal: Positive for arthralgias. Negative for myalgias, neck pain and neck stiffness.  Skin: Negative for rash.  Neurological: Positive for dizziness and light-headedness. Negative for weakness, numbness and headaches.  All  other systems reviewed and are negative.    Physical Exam Updated Vital Signs BP 124/74 (BP Location: Left Arm)   Pulse 74   Temp 98 F (36.7 C) (Oral)   Resp 16   SpO2 96%   Physical Exam  Constitutional: She is oriented to person, place, and time. She appears well-developed and well-nourished. No distress.  HENT:  Head: Normocephalic.  Eyes: Conjunctivae are normal.  Neck: Neck supple.  Cardiovascular: Normal rate, regular rhythm and normal heart sounds.  Pulmonary/Chest: Effort normal and breath sounds normal. No respiratory distress. She has no wheezes. She has no rales.  Abdominal: Soft. Bowel sounds are normal. She exhibits no distension. There is no tenderness. There is no rebound.  Musculoskeletal: She exhibits no edema.  Diffuse tenderness to palpation over anterior, lateral, posterior right shoulder joint.  She is unable to move right shoulder due to pain.  No obvious deformity or swelling.  No tenderness over the elbow.  Pain with range of motion of the elbow, however full range of motion.  Normal wrist.  Distal radial pulses intact.  No midline thoracic or lumbar spine tenderness.  Full range of motion of the all other joints.  Neurological: She is alert and oriented to person, place, and time. She displays normal reflexes. No cranial nerve deficit. Coordination normal.  Skin: Skin is warm and dry.  Psychiatric: She has a normal mood and affect. Her behavior is normal.  Nursing note and vitals reviewed.    ED Treatments / Results  Labs (all labs ordered are listed, but only abnormal results are displayed) Labs Reviewed  URINALYSIS, ROUTINE W REFLEX MICROSCOPIC - Abnormal; Notable for the following components:      Result Value   Leukocytes, UA SMALL (*)    All other components within normal limits  CBG MONITORING, ED - Abnormal; Notable for the following components:   Glucose-Capillary 113 (*)    All other components within normal limits  CBC WITH  DIFFERENTIAL/PLATELET  BASIC METABOLIC PANEL  I-STAT TROPONIN, ED    EKG EKG Interpretation  Date/Time:  Wednesday December 23 2017 15:23:34 EDT Ventricular Rate:  68 PR Interval:  148 QRS Duration: 70 QT Interval:  374 QTC Calculation: 397 R Axis:   26 Text Interpretation:  Normal sinus rhythm Normal ECG No significant change since last tracing Confirmed by Shaune PollackIsaacs, Cameron [phone]([id number]) on 12/23/2017 3:29:38 PM   Radiology Dg Chest 2 View  Result Date: 12/23/2017 CLINICAL DATA:  Initial evaluation for acute shoulder pain status post fall. EXAM: CHEST - 2 VIEW COMPARISON:  Prior radiograph from 06/12/2017. FINDINGS: Cardiac and mediastinal silhouettes are stable in size and contour, and remain within normal limits. Aortic atherosclerosis noted. Lungs normally inflated. Streaky linear densities at the peripheral left lung base most consistent with atelectasis. No focal infiltrates. No pulmonary edema or pleural effusion. No pneumothorax. No acute osseous abnormality. IMPRESSION: 1. Mild left basilar atelectasis. 2. No other active  cardiopulmonary disease. 3. Aortic atherosclerosis. Electronically Signed   By: Rise Mu M.D.   On: 12/23/2017 16:38   Dg Shoulder Right  Result Date: 12/23/2017 CLINICAL DATA:  Initial evaluation for acute shoulder pain status post fall. EXAM: RIGHT SHOULDER - 2+ VIEW COMPARISON:  None. FINDINGS: No acute fracture or dislocation. Humeral head in normal alignment within the glenoid. AC joint approximated. Moderate osteoarthritic changes present about the right glenohumeral and acromioclavicular joints. No periarticular calcification. No acute soft tissue abnormality. Visualize right hemithorax is clear. IMPRESSION: 1. No acute osseous abnormality about the right shoulder. 2. 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 Electronically Signed   By: Rise Mu M.D.   On: 12/23/2017 16:41     Procedures Procedures (including critical care time)  Medications Ordered in ED Medications - No data to display   Initial Impression / Assessment and Plan / ED Course  I have reviewed the triage vital signs and the nursing notes.  Pertinent labs & imaging results that were available during my care of the patient were reviewed by me and considered in my medical decision making (see chart for details).     Patient in the emergency department after what sounds like a near syncopal episode.  She reports injury to the right shoulder during the fall.  She did not hit her head or lost consciousness.  She is currently not dizzy.  She has no complaints other than pain to the anterior chest and right shoulder.  Will check EKG and basic labs because of near syncope, will get x-rays of the chest and shoulder to rule out injuries.  5:21 PM X-rays are negative.  Will place in a sling for comfort.  Labs are all unremarkable.  Patient is not particularly orthostatic.  She is in no acute distress, denies any dizziness, was able to eat with no difficulty.  EKG is normal.  Urine analysis unremarkable.  Patient ambulated.  It is still unclear the cause of her dizziness, however at this time patient is stable for discharge home with outpatient work-up.  Instructed to take ibuprofen or Tylenol for pain, avoid strenuous activity with the right arm.  Follow-up with family doctor closely for further evaluation of dizziness and her shoulder pain.  Vitals:   12/23/17 1154 12/23/17 1420  BP: 123/83 124/74  Pulse: 88 74  Resp: 16 16  Temp: 97.9 F (36.6 C) 98 F (36.7 C)  TempSrc: Oral Oral  SpO2: 95% 96%     Final Clinical Impressions(s) / ED Diagnoses   Final diagnoses:  Fall, initial encounter  Contusion of right shoulder, initial encounter  Dizziness    ED Discharge Orders    None       Jaynie Crumble, PA-C 12/23/17 1725    Shaune Pollack, MD 12/24/17 1821

## 2018-01-11 ENCOUNTER — Other Ambulatory Visit (HOSPITAL_COMMUNITY): Payer: Self-pay | Admitting: Emergency Medicine

## 2018-01-11 ENCOUNTER — Ambulatory Visit (HOSPITAL_COMMUNITY)
Admission: RE | Admit: 2018-01-11 | Discharge: 2018-01-11 | Disposition: A | Discharge status: Home / Self Care | Payer: Self-pay | Source: Ambulatory Visit | Attending: Emergency Medicine | Admitting: Emergency Medicine

## 2018-01-11 ENCOUNTER — Emergency Department (HOSPITAL_COMMUNITY): Payer: Self-pay

## 2018-01-11 ENCOUNTER — Emergency Department (HOSPITAL_COMMUNITY)
Admission: EM | Admit: 2018-01-11 | Discharge: 2018-01-11 | Disposition: A | Discharge status: Home / Self Care | Payer: PRIVATE HEALTH INSURANCE | Attending: Emergency Medicine | Admitting: Emergency Medicine

## 2018-01-11 ENCOUNTER — Ambulatory Visit (HOSPITAL_COMMUNITY): Payer: Self-pay

## 2018-01-11 ENCOUNTER — Encounter (HOSPITAL_COMMUNITY): Payer: Self-pay | Admitting: Emergency Medicine

## 2018-01-11 ENCOUNTER — Other Ambulatory Visit (HOSPITAL_COMMUNITY): Payer: Self-pay

## 2018-01-11 DIAGNOSIS — S0231XA Fracture of orbital floor, right side, initial encounter for closed fracture: Secondary | ICD-10-CM

## 2018-01-11 DIAGNOSIS — W19XXXA Unspecified fall, initial encounter: Secondary | ICD-10-CM | POA: Diagnosis not present

## 2018-01-11 DIAGNOSIS — E119 Type 2 diabetes mellitus without complications: Secondary | ICD-10-CM | POA: Diagnosis not present

## 2018-01-11 DIAGNOSIS — H913 Deaf nonspeaking, not elsewhere classified: Secondary | ICD-10-CM | POA: Insufficient documentation

## 2018-01-11 DIAGNOSIS — Y939 Activity, unspecified: Secondary | ICD-10-CM | POA: Insufficient documentation

## 2018-01-11 DIAGNOSIS — Y999 Unspecified external cause status: Secondary | ICD-10-CM | POA: Insufficient documentation

## 2018-01-11 DIAGNOSIS — Y929 Unspecified place or not applicable: Secondary | ICD-10-CM | POA: Insufficient documentation

## 2018-01-11 DIAGNOSIS — Z7984 Long term (current) use of oral hypoglycemic drugs: Secondary | ICD-10-CM | POA: Diagnosis not present

## 2018-01-11 DIAGNOSIS — Z79899 Other long term (current) drug therapy: Secondary | ICD-10-CM | POA: Insufficient documentation

## 2018-01-11 DIAGNOSIS — R55 Syncope and collapse: Secondary | ICD-10-CM | POA: Insufficient documentation

## 2018-01-11 DIAGNOSIS — S0990XA Unspecified injury of head, initial encounter: Secondary | ICD-10-CM | POA: Diagnosis present

## 2018-01-11 DIAGNOSIS — F17210 Nicotine dependence, cigarettes, uncomplicated: Secondary | ICD-10-CM | POA: Diagnosis not present

## 2018-01-11 DIAGNOSIS — I10 Essential (primary) hypertension: Secondary | ICD-10-CM | POA: Diagnosis not present

## 2018-01-11 DIAGNOSIS — X58XXXA Exposure to other specified factors, initial encounter: Secondary | ICD-10-CM | POA: Insufficient documentation

## 2018-01-11 DIAGNOSIS — S022XXA Fracture of nasal bones, initial encounter for closed fracture: Secondary | ICD-10-CM | POA: Diagnosis not present

## 2018-01-11 LAB — URINALYSIS, ROUTINE W REFLEX MICROSCOPIC
Bacteria, UA: NONE SEEN
Bilirubin Urine: NEGATIVE
Glucose, UA: NEGATIVE mg/dL
Hgb urine dipstick: NEGATIVE
Ketones, ur: NEGATIVE mg/dL
Nitrite: NEGATIVE
PH: 6 (ref 5.0–8.0)
PROTEIN: NEGATIVE mg/dL
Specific Gravity, Urine: 1.019 (ref 1.005–1.030)

## 2018-01-11 LAB — CBC
HCT: 40.6 % (ref 36.0–46.0)
Hemoglobin: 13.6 g/dL (ref 12.0–15.0)
MCH: 33.4 pg (ref 26.0–34.0)
MCHC: 33.5 g/dL (ref 30.0–36.0)
MCV: 99.8 fL (ref 78.0–100.0)
PLATELETS: 312 10*3/uL (ref 150–400)
RBC: 4.07 MIL/uL (ref 3.87–5.11)
RDW: 12.3 % (ref 11.5–15.5)
WBC: 7 10*3/uL (ref 4.0–10.5)

## 2018-01-11 LAB — BASIC METABOLIC PANEL
ANION GAP: 11 (ref 5–15)
BUN: 15 mg/dL (ref 6–20)
CALCIUM: 9.6 mg/dL (ref 8.9–10.3)
CO2: 27 mmol/L (ref 22–32)
Chloride: 103 mmol/L (ref 101–111)
Creatinine, Ser: 1.03 mg/dL — ABNORMAL HIGH (ref 0.44–1.00)
GFR calc Af Amer: >60 mL/min (ref >60)
GFR calc non Af Amer: 57 mL/min — ABNORMAL LOW (ref >60)
Glucose, Bld: 114 mg/dL — ABNORMAL HIGH (ref 65–99)
POTASSIUM: 3.8 mmol/L (ref 3.5–5.1)
SODIUM: 141 mmol/L (ref 135–145)

## 2018-01-11 LAB — CBG MONITORING, ED: Glucose-Capillary: 123 mg/dL — ABNORMAL HIGH (ref 65–99)

## 2018-01-11 MED ORDER — METFORMIN HCL 500 MG PO TABS
500.0000 mg | ORAL_TABLET | Freq: Once | ORAL | Status: AC
  Administered 2018-01-11: 500 mg via ORAL
  Filled 2018-01-11: qty 1

## 2018-01-11 MED ORDER — METFORMIN HCL 500 MG PO TABS: 500.0000 mg | ORAL_TABLET | Freq: Two times a day (BID) | ORAL | 0 refills | Status: AC

## 2018-01-11 NOTE — ED Notes (Signed)
PATIENT IS DEAF. SITTING OUTSIDE.

## 2018-01-11 NOTE — ED Provider Notes (Signed)
[facility] EMERGENCY DEPARTMENT Provider Note   CSN: [id number] Arrival date & time: 01/11/18  1314     History   Chief Complaint Chief Complaint  Patient presents with  . Fall  . Facial Injury    HPI Katelyn Winters is a 62 y.o. female.  62 year old female who is deaf and history via ASL interpreter iPad.  She presents to the ED with complaint of facial pain after sustaining a fall yesterday.  She said she has not taken her metformin in about 3 weeks because she ran out and had a fall.  Unclear if she had a syncopal event but fell and hit her head and noticed today how swollen her right cheek and over the bridge of her nose was.  She states she had a fall a couple weeks ago and injured her shoulder and was seen here.  She has no blurry vision no double vision.  She is complaining some pain and numbness under her right cheek.  There is no numbness no weakness.  She has been ambulatory without any difficulty.  The history is provided by the patient. The history is limited by a language barrier. A language interpreter was used.  Fall  This is a new problem. The current episode started yesterday. The problem has not changed since onset.Associated symptoms include headaches. Pertinent negatives include no chest pain, no abdominal pain and no shortness of breath. Nothing aggravates the symptoms. Nothing relieves the symptoms. She has tried nothing for the symptoms. The treatment provided no relief.  Facial Injury  Mechanism of injury:  Fall Location:  R cheek and nose Time since incident:  1 day Pain details:    Quality:  Throbbing   Severity:  Moderate   Timing:  Constant Foreign body present:  No foreign bodies Relieved by:  None tried Worsened by:  Nothing Ineffective treatments:  None tried Associated symptoms: headaches   Associated symptoms: no altered mental status, no difficulty breathing, no double vision, no ear pain, no malocclusion, no nausea, no neck  pain, no rhinorrhea and no vomiting     Past Medical History:  Diagnosis Date  . Deaf   . Diabetes mellitus without complication (HCC)   . Hypertension     There are no active problems to display for this patient.   No past surgical history on file.   OB History   None      Home Medications    Prior to Admission medications   Medication Sig Start Date End Date Taking? Authorizing Provider  albuterol (PROVENTIL HFA;VENTOLIN HFA) 108 (90 Base) MCG/ACT inhaler Inhale 1-2 puffs into the lungs every 6 (six) hours as needed for wheezing or shortness of breath. Patient not taking: Reported on 12/23/2017 03/19/17   Glynn Octave, MD  benzonatate (TESSALON) 100 MG capsule Take 1 capsule (100 mg total) by mouth every 8 (eight) hours. Patient not taking: Reported on 12/23/2017 03/19/17   Glynn Octave, MD  cyclobenzaprine (FLEXERIL) 10 MG tablet Take 1 tablet (10 mg total) by mouth at bedtime as needed. Patient not taking: Reported on 12/23/2017 06/12/17   Robinson, [place] N, PA-C  dicyclomine (BENTYL) 20 MG tablet Take 1 tablet (20 mg total) by mouth 2 (two) times daily as needed (for abdominal cramping). Patient not taking: Reported on 12/23/2017 12/14/15   Antony Madura, PA-C  doxycycline (VIBRAMYCIN) 100 MG capsule Take 1 capsule (100 mg total) by mouth 2 (two) times daily. Patient not taking: Reported on 12/23/2017 03/19/17   Rancour,  Jeannett Senior, MD  HYDROcodone-acetaminophen (NORCO/VICODIN) 5-325 MG tablet Take 1-2 tablets by mouth every 6 (six) hours as needed for severe pain. Patient not taking: Reported on 12/23/2017 06/12/17   Robinson, [place] N, PA-C  metFORMIN (GLUCOPHAGE) 500 MG tablet Take 500 mg by mouth 2 (two) times daily with a meal.    [provider]  Multiple Vitamin (MULTIVITAMIN WITH MINERALS) TABS tablet Take 1 tablet by mouth daily.    [provider]  Omega-3 Fatty Acids (FISH OIL PO) Take 50 mg by mouth daily.    [provider]  predniSONE  (DELTASONE) 50 MG tablet 1 tablet PO daily Patient not taking: Reported on 12/23/2017 03/19/17   Glynn Octave, MD  promethazine (PHENERGAN) 25 MG tablet Take 1 tablet (25 mg total) by mouth every 6 (six) hours as needed for nausea or vomiting. Patient not taking: Reported on 12/23/2017 12/14/15   Antony Madura, PA-C    Family History No family history on file.  Social History Social History   Tobacco Use  . Smoking status: Current Every Day Smoker    Types: Cigarettes  . Smokeless tobacco: Never Used  Substance Use Topics  . Alcohol use: Yes  . Drug use: No     Allergies   Patient has no known allergies.   Review of Systems Review of Systems  Constitutional: Negative for chills and fever.  HENT: Positive for facial swelling. Negative for ear pain, rhinorrhea and sore throat.   Eyes: Negative for double vision, pain and visual disturbance.  Respiratory: Negative for cough and shortness of breath.   Cardiovascular: Negative for chest pain and palpitations.  Gastrointestinal: Negative for abdominal pain, nausea and vomiting.  Genitourinary: Negative for dysuria and hematuria.  Musculoskeletal: Negative for arthralgias, back pain and neck pain.  Skin: Negative for color change and rash.  Neurological: Positive for headaches. Negative for seizures and weakness.  All other systems reviewed and are negative.    Physical Exam Updated Vital Signs BP (!) 155/114 (BP Location: Right Arm)   Pulse 71   Temp 98.2 F (36.8 C) (Oral)   Resp 16   Ht  (1.676 m)   Wt 83.9 kg (185 lb)   SpO2 98%   BMI 29.86 kg/m   Physical Exam  Constitutional: She is oriented to person, place, and time. She appears well-developed and well-nourished.  HENT:  Head: Normocephalic and atraumatic.  Right Ear: External ear normal.  Left Ear: External ear normal.  Mouth/Throat: Oropharynx is clear and moist. No oropharyngeal exudate.  He is got some ecchymosis under her right orbit.   Extraocular movements are intact.  Tenderness over the right zygoma and nasal bridge.  There is no septal hematoma.  She does have some decreased sensation over the inferior area although has sensation.  There is no malocclusion.  Left-sided face nontender.  Eyes: Pupils are equal, round, and reactive to light. Conjunctivae and EOM are normal.  Neck: Normal range of motion. Neck supple.  Cardiovascular: Normal rate, regular rhythm and normal heart sounds.  Pulmonary/Chest: Breath sounds normal. No respiratory distress. She has no wheezes.  Abdominal: Soft. She exhibits no mass. There is no tenderness. There is no guarding.  Musculoskeletal: Normal range of motion. She exhibits no deformity.  Neurological: She is alert and oriented to person, place, and time. She has normal strength. No cranial nerve deficit or sensory deficit. Gait normal. GCS eye subscore is 4. GCS verbal subscore is 5. GCS motor subscore is 6.  Skin: Skin  is warm and dry.  Psychiatric: She has a normal mood and affect.     ED Treatments / Results  Labs (all labs ordered are listed, but only abnormal results are displayed) Labs Reviewed  BASIC METABOLIC PANEL - Abnormal; Notable for the following components:      Result Value   Glucose, Bld 114 (*)    Creatinine, Ser 1.03 (*)    GFR calc non Af Amer 57 (*)    All other components within normal limits  CBG MONITORING, ED - Abnormal; Notable for the following components:   Glucose-Capillary 123 (*)    All other components within normal limits  CBC  URINALYSIS, ROUTINE W REFLEX MICROSCOPIC    EKG EKG Interpretation  Date/Time:  Monday Jan 11 2018 15:21:27 EDT Ventricular Rate:  67 PR Interval:  138 QRS Duration: 66 QT Interval:  374 QTC Calculation: 395 R Axis:   48 Text Interpretation:  Normal sinus rhythm Normal ECG unchanged from prior 4/19 Confirmed by Meridee Score [phone]) on 01/11/2018 8:21:45 PM   Radiology Ct Head Wo Contrast  Result Date:  01/11/2018 CLINICAL DATA:  Recent fall with facial bruising. EXAM: CT HEAD WITHOUT CONTRAST CT MAXILLOFACIAL WITHOUT CONTRAST TECHNIQUE: Multidetector CT imaging of the head and maxillofacial structures were performed using the standard protocol without intravenous contrast. Multiplanar CT image reconstructions of the maxillofacial structures were also generated. COMPARISON:  None. FINDINGS: CT HEAD FINDINGS Brain: No mass lesion, intraparenchymal hemorrhage or extra-axial collection. No evidence of acute cortical infarct. Brain parenchyma and CSF-containing spaces are normal for age. Vascular: No hyperdense vessel or unexpected calcification. Skull: No calvarial fracture. Normal skull base. CT MAXILLOFACIAL FINDINGS Osseous: --Complex facial fracture types: No LeFort, zygomaticomaxillary complex or nasoorbitoethmoidal fracture. --Simple fracture types: Minimally displaced fracture of the right orbital floor. Irregularity of the right nasal bone, likely age-indeterminate fracture. --Mandible, hard palate and teeth: Periapical lucency of tooth 9 with defect of the anterior maxillary cortex. Orbits: Mildly inferiorly displaced fracture of the floor of the right orbit with inflammation of the inferior extraconal fat. No evidence of extraocular muscle entrapment. Sinuses: Small amount of blood in the upper right maxillary sinus. Soft tissues: Normal visualized extracranial soft tissues. IMPRESSION: 1. No acute intracranial abnormality. 2. Mildly depressed fracture of the right orbital floor without evidence of extraocular muscle entrapment. 3. Age-indeterminate fracture of the right nasal bone. 4. Large periapical lucency at the root of the maxillary left central incisor with defect of the anterior maxillary cortex. Electronically Signed   By: Deatra Robinson M.D.   On: 01/11/2018 18:11   Ct Maxillofacial Wo Contrast  Result Date: 01/11/2018 CLINICAL DATA:  Recent fall with facial bruising. EXAM: CT HEAD WITHOUT  CONTRAST CT MAXILLOFACIAL WITHOUT CONTRAST TECHNIQUE: Multidetector CT imaging of the head and maxillofacial structures were performed using the standard protocol without intravenous contrast. Multiplanar CT image reconstructions of the maxillofacial structures were also generated. COMPARISON:  None. FINDINGS: CT HEAD FINDINGS Brain: No mass lesion, intraparenchymal hemorrhage or extra-axial collection. No evidence of acute cortical infarct. Brain parenchyma and CSF-containing spaces are normal for age. Vascular: No hyperdense vessel or unexpected calcification. Skull: No calvarial fracture. Normal skull base. CT MAXILLOFACIAL FINDINGS Osseous: --Complex facial fracture types: No LeFort, zygomaticomaxillary complex or nasoorbitoethmoidal fracture. --Simple fracture types: Minimally displaced fracture of the right orbital floor. Irregularity of the right nasal bone, likely age-indeterminate fracture. --Mandible, hard palate and teeth: Periapical lucency of tooth 9 with defect of the anterior maxillary cortex. Orbits: Mildly inferiorly displaced fracture  of the floor of the right orbit with inflammation of the inferior extraconal fat. No evidence of extraocular muscle entrapment. Sinuses: Small amount of blood in the upper right maxillary sinus. Soft tissues: Normal visualized extracranial soft tissues. IMPRESSION: 1. No acute intracranial abnormality. 2. Mildly depressed fracture of the right orbital floor without evidence of extraocular muscle entrapment. 3. Age-indeterminate fracture of the right nasal bone. 4. Large periapical lucency at the root of the maxillary left central incisor with defect of the anterior maxillary cortex. Electronically Signed   By: Deatra Robinson M.D.   On: 01/11/2018 18:11    Procedures Procedures (including critical care time)  Medications Ordered in ED Medications  metFORMIN (GLUCOPHAGE) tablet 500 mg (500 mg Oral Given 01/11/18 2058)     Initial Impression / Assessment and  Plan / ED Course  I have reviewed the triage vital signs and the nursing notes.  Pertinent labs & imaging results that were available during my care of the patient were reviewed by me and considered in my medical decision making (see chart for details).      Final Clinical Impressions(s) / ED Diagnoses   Final diagnoses:  Closed fracture of right orbital floor, initial encounter [facility])  Closed fracture of nasal bone, initial encounter  Fall, initial encounter    ED Discharge Orders        Ordered    metFORMIN (GLUCOPHAGE) 500 MG tablet  2 times daily with meals     01/11/18 2050       Terrilee Files, MD 01/12/18 [phone]

## 2018-01-11 NOTE — Discharge Instructions (Addendum)
You are evaluated in the emergency department for a fall in which she had an injury to your face.  By CAT scan they found a nondisplaced fracture on the floor of your orbit and a small nasal fracture.  These will likely not require any type of surgery but we were giving you the office of the ear nose and throat doctor to follow-up with.  You can use ice to the area and take Tylenol for pain.  Please return to the emergency department if you have any difficulty with your vision or other concerns.  You also ran out of your metformin and we are prescribing you this.  You will need to follow-up with primary care doctor for further medications.

## 2018-01-11 NOTE — ED Notes (Signed)
ASL translator arrived to offer translation services. Unable to locate pt. Rn notified.

## 2018-01-11 NOTE — ED Provider Notes (Signed)
MSE was initiated and I personally evaluated the patient and placed orders (if any) at  2:59 PM on Jan 11, 2018.  The patient appears stable so that the remainder of the MSE may be completed by another provider.   Patient placed in Quick Look pathway, seen and evaluated  She speaks American sign language and translation services utilized  Chief Complaint: Syncope  HPI:   Patient here for second episode of syncope in the last 2 weeks.  She states that last night around 12 PM she had an episode of syncope.  She states that she had no prodrome and hit the floor.  She complains of severe pain in her face.  She has a black eye on the right.  ROS: syncope (one)  Physical Exam:   Gen: No distress  Neuro: Awake and Alert  Skin: Warm    Focused Exam: Bruising around the right eye, no loose teeth, no pain with eye movement   Initiation of care has begun. The patient has been counseled on the process, plan, and necessity for staying for the completion/evaluation, and the remainder of the medical screening examination     Arthor Captain, PA-C 01/11/18 1933    Cathren Laine, MD 01/12/18 [phone]

## 2018-01-11 NOTE — ED Notes (Signed)
Sign interpreter used with PA/ triage

## 2018-01-11 NOTE — Progress Notes (Addendum)
Late Note: Taxi voucher left at the front desk (first nurse desk) for pt.   Montine Circle, [facility]  [phone]

## 2018-01-11 NOTE — ED Notes (Signed)
Discharge instructions and prescription discussed with Pt. Pt verbalized understanding. Pt stable and ambulatory.    

## 2018-01-11 NOTE — ED Triage Notes (Signed)
Pt is deaf. Pt states she fell on mothers day causing pain and bruising to her face. Pt is diabetic. CBG was 300 this morning. Pt also had a fall last week and had an injury to her left elbow and right shoulder. Pt is also out of her Metformin and has been for 3 weeks.

## 2019-07-13 ENCOUNTER — Emergency Department (HOSPITAL_COMMUNITY): Payer: Self-pay

## 2019-07-13 ENCOUNTER — Other Ambulatory Visit: Payer: Self-pay

## 2019-07-13 ENCOUNTER — Encounter (HOSPITAL_COMMUNITY): Payer: Self-pay | Admitting: Emergency Medicine

## 2019-07-13 ENCOUNTER — Emergency Department (HOSPITAL_COMMUNITY)
Admission: EM | Admit: 2019-07-13 | Discharge: 2019-07-14 | Disposition: A | Discharge status: Home / Self Care | Payer: Self-pay | Attending: Emergency Medicine | Admitting: Emergency Medicine

## 2019-07-13 DIAGNOSIS — I862 Pelvic varices: Secondary | ICD-10-CM | POA: Insufficient documentation

## 2019-07-13 DIAGNOSIS — Z79899 Other long term (current) drug therapy: Secondary | ICD-10-CM | POA: Insufficient documentation

## 2019-07-13 DIAGNOSIS — I10 Essential (primary) hypertension: Secondary | ICD-10-CM | POA: Insufficient documentation

## 2019-07-13 DIAGNOSIS — Z7984 Long term (current) use of oral hypoglycemic drugs: Secondary | ICD-10-CM | POA: Insufficient documentation

## 2019-07-13 DIAGNOSIS — E119 Type 2 diabetes mellitus without complications: Secondary | ICD-10-CM | POA: Insufficient documentation

## 2019-07-13 DIAGNOSIS — F17210 Nicotine dependence, cigarettes, uncomplicated: Secondary | ICD-10-CM | POA: Insufficient documentation

## 2019-07-13 DIAGNOSIS — R109 Unspecified abdominal pain: Secondary | ICD-10-CM

## 2019-07-13 LAB — COMPREHENSIVE METABOLIC PANEL
ALT: 15 U/L (ref 0–44)
AST: 20 U/L (ref 15–41)
Albumin: 3.8 g/dL (ref 3.5–5.0)
Alkaline Phosphatase: 88 U/L (ref 38–126)
Anion gap: 9 (ref 5–15)
BUN: 13 mg/dL (ref 8–23)
CO2: 26 mmol/L (ref 22–32)
Calcium: 9.2 mg/dL (ref 8.9–10.3)
Chloride: 105 mmol/L (ref 98–111)
Creatinine, Ser: 0.97 mg/dL (ref 0.44–1.00)
GFR calc Af Amer: >60 mL/min (ref >60)
GFR calc non Af Amer: >60 mL/min (ref >60)
Glucose, Bld: 91 mg/dL (ref 70–99)
Potassium: 4.4 mmol/L (ref 3.5–5.1)
Sodium: 140 mmol/L (ref 135–145)
Total Bilirubin: 0.4 mg/dL (ref 0.3–1.2)
Total Protein: 7.3 g/dL (ref 6.5–8.1)

## 2019-07-13 LAB — URINALYSIS, ROUTINE W REFLEX MICROSCOPIC
Bacteria, UA: NONE SEEN
Bilirubin Urine: NEGATIVE
Glucose, UA: NEGATIVE mg/dL
Hgb urine dipstick: NEGATIVE
Ketones, ur: NEGATIVE mg/dL
Nitrite: NEGATIVE
Protein, ur: NEGATIVE mg/dL
Specific Gravity, Urine: 1.02 (ref 1.005–1.030)
pH: 5 (ref 5.0–8.0)

## 2019-07-13 LAB — CBC
HCT: 40.3 % (ref 36.0–46.0)
Hemoglobin: 13.1 g/dL (ref 12.0–15.0)
MCH: 33.2 pg (ref 26.0–34.0)
MCHC: 32.5 g/dL (ref 30.0–36.0)
MCV: 102.3 fL — ABNORMAL HIGH (ref 80.0–100.0)
Platelets: 299 10*3/uL (ref 150–400)
RBC: 3.94 MIL/uL (ref 3.87–5.11)
RDW: 11.5 % (ref 11.5–15.5)
WBC: 5.9 10*3/uL (ref 4.0–10.5)
nRBC: 0 % (ref 0.0–0.2)

## 2019-07-13 LAB — LIPASE, BLOOD: Lipase: 30 U/L (ref 11–51)

## 2019-07-13 MED ORDER — IOHEXOL 300 MG/ML  SOLN
100.0000 mL | Freq: Once | INTRAMUSCULAR | Status: AC | PRN
  Administered 2019-07-13: 22:00:00 100 mL via INTRAVENOUS

## 2019-07-13 MED ORDER — SODIUM CHLORIDE 0.9% FLUSH: 3.0000 mL | Freq: Once | INTRAVENOUS | Status: DC

## 2019-07-13 NOTE — ED Triage Notes (Signed)
Pt is deaf, interpreter used. Pt reports RLQ abd pain that radiates to her rt back that started this morning. Pt reports nausea. Denies V/D. Pt is scheduled for a colonoscopy in December. Last BM yesterday. Hx of diabetes and htn. Denies any urinary issues.

## 2019-07-14 ENCOUNTER — Other Ambulatory Visit: Payer: Self-pay

## 2019-07-14 LAB — CBG MONITORING, ED: Glucose-Capillary: 155 mg/dL — ABNORMAL HIGH (ref 70–99)

## 2019-07-14 MED ORDER — HYDROCODONE-ACETAMINOPHEN 5-325 MG PO TABS
1.0000 | ORAL_TABLET | Freq: Once | ORAL | Status: AC
  Administered 2019-07-14: 02:00:00 1 via ORAL
  Filled 2019-07-14: qty 1

## 2019-07-14 MED ORDER — HYDROCODONE-ACETAMINOPHEN 5-325 MG PO TABS: 1.0000 | ORAL_TABLET | Freq: Four times a day (QID) | ORAL | 0 refills | Status: DC | PRN

## 2019-07-14 NOTE — ED Provider Notes (Signed)
Emergency Department Provider Note   I have reviewed the triage vital signs and the nursing notes.   HISTORY  Chief Complaint Abdominal Pain and Back Pain   HPI Katelyn Winters is a 63 y.o. female with medical problems document below who presents the emergency department today with right-sided pain that radiates around her back.  Patient states that started earlier today.  She never had this before.  She had a little bit of soft stools but that is related to taking MiraLAX recently.  She denies any nausea or vomiting.  No fevers.  No urinary symptoms such as hematuria, dysuria or increased frequency.  No history of kidney stones that she knows of.  No other associated symptoms.   No other associated or modifying symptoms.    Past Medical History:  Diagnosis Date  . Deaf   . Diabetes mellitus without complication ([place])   . Hypertension     There are no active problems to display for this patient.   Past Surgical History:  Procedure Laterality Date  . BLADDER SURGERY      Current Outpatient Rx  . Order #: [id number] Class: Normal  . Order #: [id number] Class: Print  . Order #: [id number] Class: Historical Med  . Order #: [id number] Class: Historical Med  . Order #: [id number] Class: Print  . Order #: [id number] Class: Print    Allergies Patient has no known allergies.  No family history on file.  Social History Social History   Tobacco Use  . Smoking status: Current Some Day Smoker    Types: Cigarettes  . Smokeless tobacco: Never Used  Substance Use Topics  . Alcohol use: Yes    Comment: occ  . Drug use: No    Review of Systems  All other systems negative except as documented in the HPI. All pertinent positives and negatives as reviewed in the HPI. ____________________________________________   PHYSICAL EXAM:  VITAL SIGNS: ED Triage Vitals  Enc Vitals Group     BP 07/13/19 1937 139/77     Pulse Rate 07/13/19 1937 74     Resp 07/13/19 1937 18     Temp  07/13/19 1937 98 F (36.7 C)     Temp Source 07/13/19 1937 Oral     SpO2 07/13/19 1937 100 %     Weight 07/13/19 1942 210 lb (95.3 kg)     Height 07/13/19 1942 5\' 6"  (1.676 m)    Constitutional: Alert and oriented. Well appearing and in no acute distress. Eyes: Conjunctivae are normal. PERRL. EOMI. Head: Atraumatic. Nose: No congestion/rhinnorhea. Mouth/Throat: Mucous membranes are moist.  Oropharynx non-erythematous. Neck: No stridor.  No meningeal signs.   Cardiovascular: Normal rate, regular rhythm. Good peripheral circulation. Grossly normal heart sounds.   Respiratory: Normal respiratory effort.  No retractions. Lungs CTAB. Gastrointestinal: Soft and mild RLQ ttp. No distention.  Musculoskeletal: No lower extremity tenderness nor edema. No gross deformities of extremities. Neurologic:  Normal speech and language. No gross focal neurologic deficits are appreciated.  Skin:  Skin is warm, dry and intact. No rash noted.   ____________________________________________   LABS (all labs ordered are listed, but only abnormal results are displayed)  Labs Reviewed  CBC - Abnormal; Notable for the following components:      Result Value   MCV 102.3 (*)    All other components within normal limits  URINALYSIS, ROUTINE W REFLEX MICROSCOPIC - Abnormal; Notable for the following components:   Leukocytes,Ua TRACE (*)    All other components within normal limits  LIPASE,  BLOOD  COMPREHENSIVE METABOLIC PANEL   ____________________________________________  RADIOLOGY  Ct Abdomen Pelvis W Contrast  Result Date: 07/13/2019 CLINICAL DATA:  Right lower quadrant pain EXAM: CT ABDOMEN AND PELVIS WITH CONTRAST TECHNIQUE: Multidetector CT imaging of the abdomen and pelvis was performed using the standard protocol following bolus administration of intravenous contrast. CONTRAST:  100mL OMNIPAQUE IOHEXOL 300 MG/ML  SOLN COMPARISON:  November 19, 2014 FINDINGS: Lower chest: The visualized heart size  within normal limits. No pericardial fluid/thickening. No hiatal hernia. The visualized portions of the lungs are clear. Hepatobiliary: The liver is normal in density without focal abnormality.The main portal vein is patent. The patient is status post cholecystectomy. No biliary ductal dilation. There is stable dilation of the extrahepatic biliary duct. Pancreas: Unremarkable. No pancreatic ductal dilatation or surrounding inflammatory changes. Spleen: Normal in size without focal abnormality. Adrenals/Urinary Tract: Both adrenal glands appear normal. The kidneys and collecting system appear normal without evidence of urinary tract calculus or hydronephrosis. Bladder is unremarkable. Stomach/Bowel: The stomach, small bowel, and colon are normal in appearance. No inflammatory changes, wall thickening, or obstructive findings.The appendix is normal. Vascular/Lymphatic: There are no enlarged mesenteric, retroperitoneal, or pelvic lymph nodes. No significant vascular findings are present. 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 Reproductive:  The uterus and adnexa are unremarkable. Other: No evidence of abdominal wall mass or hernia. Musculoskeletal: No acute or significant osseous findings. IMPRESSION: No acute intra-abdominal or pelvic pathology to explain the patient's symptoms. 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 Electronically Signed   By: Jonna ClarkBindu  Avutu M.D.   On: 07/13/2019 22:54    ____________________________________________  INITIAL IMPRESSION / ASSESSMENT AND PLAN / ED COURSE  No right upper quadrant pain to suggest gallbladder or pancreatic disease.  CT shows a normal appendix and with a normal white count and no evidence of peritonitis I think it is unlikely that she has an occult appendicitis.  She is on MiraLAX could be gas pains.  Could be a passed kidney stone that  were not seen on CT scan.  Is difficult to tell at this point.  Patient does not appear toxic and has no evidence of peritonitis or surgical abdomen.  Lavonna RuaSheri has an appointment with GI can follow-up there for further management.  She also was found to have diminutive IVC on CT scan so plan for vascular consult to see if that could be related to her pelvic tortuosity and possible pain.  Stable for discharge at this time.  Pertinent labs & imaging results that were available during my care of the patient were reviewed by me and considered in my medical decision making (see chart for details).  A medical screening exam was performed and I feel the patient has had an appropriate workup for their chief complaint at this time and likelihood of emergent condition existing is low. They have been counseled on decision, discharge, follow up and which symptoms necessitate immediate return to the emergency department. They or their family verbally stated understanding and agreement with plan and discharged in stable condition.   ____________________________________________  FINAL CLINICAL IMPRESSION(S) / ED DIAGNOSES  Final diagnoses:  Abdominal pain, unspecified abdominal location  Varicosities of pelvis     MEDICATIONS GIVEN DURING THIS VISIT:  Medications  sodium chloride flush (NS) 0.9 % injection 3 mL (has no administration in time range)  HYDROcodone-acetaminophen (NORCO/VICODIN) 5-325 MG per tablet 1 tablet (has  no administration in time range)  iohexol (OMNIPAQUE) 300 MG/ML solution 100 mL (100 mLs Intravenous Contrast Given 07/13/19 2222)     NEW OUTPATIENT MEDICATIONS STARTED DURING THIS VISIT:  New Prescriptions   HYDROCODONE-ACETAMINOPHEN (NORCO/VICODIN) 5-325 MG TABLET    Take 1 tablet by mouth every 6 (six) hours as needed for moderate pain.    Note:  This note was prepared with assistance of Dragon voice recognition software. Occasional wrong-word or sound-a-like substitutions may  have occurred due to the inherent limitations of voice recognition software.   Jakaiden Fill, Barbara Cower, MD 07/14/19 [phone]

## 2019-07-14 NOTE — ED Notes (Signed)
Patient verbalizes understanding of discharge instructions. Opportunity for questioning and answers were provided. Armband removed by staff, pt discharged from ED.  

## 2019-08-02 ENCOUNTER — Ambulatory Visit: Payer: Self-pay | Admitting: Internal Medicine

## 2019-09-19 ENCOUNTER — Other Ambulatory Visit: Payer: Self-pay

## 2019-09-19 ENCOUNTER — Encounter: Payer: Self-pay | Admitting: Surgery

## 2019-09-19 ENCOUNTER — Ambulatory Visit (INDEPENDENT_AMBULATORY_CARE_PROVIDER_SITE_OTHER): Payer: Self-pay | Admitting: Surgery

## 2019-09-19 VITALS — BP 128/79 | HR 85 | Temp 97.9°F | Resp 20 | Ht 66.0 in | Wt 203.0 lb

## 2019-09-19 DIAGNOSIS — Q269 Congenital malformation of great vein, unspecified: Secondary | ICD-10-CM

## 2019-09-19 NOTE — Progress Notes (Signed)
[facility] and [facility] Specialist of [facility]  Patient name: Katelyn Winters MRN: [id number] DOB: 02-19-1956 Sex: female   REQUESTING PROVIDER:    ER    REASON FOR CONSULT:    Small IVC  HISTORY OF PRESENT ILLNESS:   Katelyn Winters is a 64 y.o. 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  She was in the emergency department back and November 2020 for right-sided abdominal pain.  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  She is scheduled for vascular consultation.  The patient's pain is located in the right lower quadrant.  She denies any history of leg swelling or leg pain or tingling.  The patient does suffer from diabetes without complication.  She is medically managed for hypertension.  She is hearing impaired.  PAST MEDICAL HISTORY    Past Medical History:  Diagnosis Date  . Deaf   . Diabetes mellitus without complication ([place])   . Hypertension      FAMILY HISTORY   History reviewed. No pertinent family history.  SOCIAL HISTORY:   Social History   Socioeconomic History  . Marital status: Single    Spouse name: Not on file  . Number of children: Not on file  . Years of education: Not on file  . Highest education level: Not on file  Occupational History  . Not on file  Tobacco Use  . Smoking status: Current Some Day Smoker    Types: Cigarettes  . Smokeless tobacco: Never Used  Substance and Sexual Activity  . Alcohol use: Yes    Comment: occ  . Drug use: No  . Sexual activity: Not on file  Other Topics Concern  . Not on file  Social History Narrative  . Not on file   Social Determinants of Health   Financial Resource Strain:   . Difficulty of Paying Living Expenses: Not on file  Food Insecurity:   . Worried About Charity fundraiser in the Last Year: Not on file  . Ran Out of Food in the Last Year: Not on file  Transportation Needs:   . Lack of Transportation  (Medical): Not on file  . Lack of Transportation (Non-Medical): Not on file  Physical Activity:   . Days of Exercise per Week: Not on file  . Minutes of Exercise per Session: Not on file  Stress:   . Feeling of Stress : Not on file  Social Connections:   . Frequency of Communication with Friends and Family: Not on file  . Frequency of Social Gatherings with Friends and Family: Not on file  . Attends Religious Services: Not on file  . Active Member of Clubs or Organizations: Not on file  . Attends Archivist Meetings: Not on file  . Marital Status: Not on file  Intimate Partner Violence:   . Fear of Current or Ex-Partner: Not on file  . Emotionally Abused: Not on file  . Physically Abused: Not on file  . Sexually Abused: Not on file    ALLERGIES:    No Known Allergies  CURRENT MEDICATIONS:    Current Outpatient Medications  Medication Sig Dispense Refill  . metFORMIN (GLUCOPHAGE) 500 MG tablet Take 1 tablet (500 mg total) by mouth 2 (two) times daily with a meal. 60 tablet 0  . HYDROcodone-acetaminophen (NORCO/VICODIN) 5-325 MG tablet Take 1 tablet by mouth every 6 (six) hours as needed for moderate pain. (Patient not taking: Reported on 09/19/2019) 10 tablet  0  . Multiple Vitamin (MULTIVITAMIN WITH MINERALS) TABS tablet Take 1 tablet by mouth daily.    . Omega-3 Fatty Acids (FISH OIL PO) Take 50 mg by mouth daily.     No current facility-administered medications for this visit.    REVIEW OF SYSTEMS:   [X]  denotes positive finding, [ ]  denotes negative finding Cardiac  Comments:  Chest pain or chest pressure:    Shortness of breath upon exertion:    Short of breath when lying flat:    Irregular heart rhythm:        Vascular    Pain in calf, thigh, or hip brought on by ambulation:    Pain in feet at night that wakes you up from your sleep:     Blood clot in your veins:    Leg swelling:         Pulmonary    Oxygen at home:    Productive cough:       Wheezing:         Neurologic    Sudden weakness in arms or legs:     Sudden numbness in arms or legs:     Sudden onset of difficulty speaking or slurred speech:    Temporary loss of vision in one eye:     Problems with dizziness:         Gastrointestinal    Blood in stool:      Vomited blood:         Genitourinary    Burning when urinating:     Blood in urine:        Psychiatric    Major depression:         Hematologic    Bleeding problems:    Problems with blood clotting too easily:        Skin    Rashes or ulcers:        Constitutional    Fever or chills:     PHYSICAL EXAM:   Vitals:   09/19/19 1019  BP: 128/79  Pulse: 85  Resp: 20  Temp: 97.9 F (36.6 C)  SpO2: 94%  Weight: 203 lb (92.1 kg)  Height: 5\' 6"  (1.676 m)    GENERAL: The patient is a well-nourished female, in no acute distress. The vital signs are documented above. CARDIAC: There is a regular rate and rhythm.  PULMONARY: Nonlabored respirations MUSCULOSKELETAL: There are no major deformities or cyanosis. NEUROLOGIC: No focal weakness or paresthesias are detected. SKIN: There are no ulcers or rashes noted. PSYCHIATRIC: The patient has a normal affect.  STUDIES:   I have reviewed her CT scan with the following findings: No acute intra-abdominal or pelvic pathology to explain the patient's symptoms.  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  Her mom is also on telephone.  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  I told her that I do not feel this is causing any of her right lower quadrant symptoms.  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  This was evaluated with CT scan in 2016.  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  She is currently without symptoms of  swelling in her legs.  I told her I believe this is an incidental finding.  No intervention is recommended.  She can follow-up on an as-needed basis.   Charlena Cross, MD, FACS [facility] [phone] Pager [phone]

## 2019-10-04 ENCOUNTER — Ambulatory Visit: Payer: Self-pay | Attending: Internal Medicine

## 2019-10-04 DIAGNOSIS — Z20822 Contact with and (suspected) exposure to covid-19: Secondary | ICD-10-CM

## 2019-10-05 LAB — NOVEL CORONAVIRUS, NAA: SARS-CoV-2, NAA: NOT DETECTED

## 2019-10-06 ENCOUNTER — Telehealth: Payer: Self-pay | Admitting: Physician Assistant

## 2019-10-06 NOTE — Telephone Encounter (Signed)
Negative COVID results given. Patient results "NOT Detected." Caller expressed understanding. ° °

## 2020-01-10 ENCOUNTER — Encounter (HOSPITAL_COMMUNITY): Payer: Self-pay | Admitting: Emergency Medicine

## 2020-01-10 ENCOUNTER — Emergency Department (HOSPITAL_COMMUNITY)
Admission: EM | Admit: 2020-01-10 | Discharge: 2020-01-10 | Disposition: A | Discharge status: Home / Self Care | Payer: Self-pay | Attending: Emergency Medicine | Admitting: Emergency Medicine

## 2020-01-10 ENCOUNTER — Other Ambulatory Visit: Payer: Self-pay

## 2020-01-10 DIAGNOSIS — E119 Type 2 diabetes mellitus without complications: Secondary | ICD-10-CM | POA: Insufficient documentation

## 2020-01-10 DIAGNOSIS — F17210 Nicotine dependence, cigarettes, uncomplicated: Secondary | ICD-10-CM | POA: Insufficient documentation

## 2020-01-10 DIAGNOSIS — M3501 Sicca syndrome with keratoconjunctivitis: Secondary | ICD-10-CM | POA: Insufficient documentation

## 2020-01-10 DIAGNOSIS — Z7984 Long term (current) use of oral hypoglycemic drugs: Secondary | ICD-10-CM | POA: Insufficient documentation

## 2020-01-10 DIAGNOSIS — R21 Rash and other nonspecific skin eruption: Secondary | ICD-10-CM

## 2020-01-10 DIAGNOSIS — I10 Essential (primary) hypertension: Secondary | ICD-10-CM | POA: Insufficient documentation

## 2020-01-10 MED ORDER — HYDROCORTISONE 2.5 % EX LOTN: TOPICAL_LOTION | Freq: Two times a day (BID) | CUTANEOUS | 0 refills | Status: DC

## 2020-01-10 MED ORDER — CLOTRIMAZOLE 1 % EX CREA: TOPICAL_CREAM | CUTANEOUS | 0 refills | Status: DC

## 2020-01-10 MED ORDER — POLYVINYL ALCOHOL 1.4 % OP SOLN
1.0000 [drp] | OPHTHALMIC | Status: DC | PRN
  Filled 2020-01-10: qty 15

## 2020-01-10 NOTE — ED Provider Notes (Signed)
[facility] EMERGENCY DEPARTMENT Provider Note   CSN: [id number] Arrival date & time: 01/10/20  2000     History Chief Complaint  Patient presents with  . Rash  . Eye Problem    Katelyn Winters is a 64 y.o. female with PMH significant for type II DM and deafness who presents to the ED with a 39-month history of left-sided itchy neck rash as well as a 54-month history of eyelid crusting and dry eye sensation.  Patient reports that she has been applying cocoa butter to the left side of her neck, with no relief.  She states that it is very itchy at night and her partner has to forcibly keep her from scratching herself.  She states that it feels "moist".  As for her crusty eyelids and dry eye sensation, she was informed that perhaps it was from touching her eyes with dirty hands.  She has been using lubricant drops, with little relief.  Her primary care provider is Myra Gianotti, PA-C with [facility], however she has not notified her of these complaints.  She denies any fevers or chills, recent illness or infection, dry mouth or cavities, chest pain or shortness of breath, other rash, new jewelry or obvious contact irritants for her 56-month history of neck rash, or other symptoms.  HPI     Past Medical History:  Diagnosis Date  . Deaf   . Diabetes mellitus without complication ([place])   . Hypertension     There are no problems to display for this patient.   Past Surgical History:  Procedure Laterality Date  . BLADDER SURGERY       OB History   No obstetric history on file.     No family history on file.  Social History   Tobacco Use  . Smoking status: Current Some Day Smoker    Types: Cigarettes  . Smokeless tobacco: Never Used  Substance Use Topics  . Alcohol use: Yes    Comment: occ  . Drug use: No    Home Medications Prior to Admission medications   Medication Sig Start Date End Date Taking? Authorizing Provider  clotrimazole (LOTRIMIN) 1 % cream  Mix with the hydrocortisone cream before applying twice daily. 01/10/20   Corena Herter, PA-C  HYDROcodone-acetaminophen (NORCO/VICODIN) 5-325 MG tablet Take 1 tablet by mouth every 6 (six) hours as needed for moderate pain. Patient not taking: Reported on 09/19/2019 07/14/19   Mesner, Corene Cornea, MD  hydrocortisone 2.5 % lotion Apply topically 2 (two) times daily. Mix with the clotrimazole lotion before applying twice daily. 01/10/20   Corena Herter, PA-C  metFORMIN (GLUCOPHAGE) 500 MG tablet Take 1 tablet (500 mg total) by mouth 2 (two) times daily with a meal. 01/11/18   Hayden Rasmussen, MD  Multiple Vitamin (MULTIVITAMIN WITH MINERALS) TABS tablet Take 1 tablet by mouth daily.    [provider]  Omega-3 Fatty Acids (FISH OIL PO) Take 50 mg by mouth daily.    [provider]  albuterol (PROVENTIL HFA;VENTOLIN HFA) 108 (90 Base) MCG/ACT inhaler Inhale 1-2 puffs into the lungs every 6 (six) hours as needed for wheezing or shortness of breath. Patient not taking: Reported on 12/23/2017 03/19/17 07/14/19  Ezequiel Essex, MD  dicyclomine (BENTYL) 20 MG tablet Take 1 tablet (20 mg total) by mouth 2 (two) times daily as needed (for abdominal cramping). Patient not taking: Reported on 12/23/2017 12/14/15 07/14/19  Antonietta Breach, PA-C    Allergies    Patient has  no known allergies.  Review of Systems   Review of Systems  Constitutional: Negative for chills and fever.  Eyes: Positive for itching. Negative for pain and visual disturbance.  Respiratory: Negative for shortness of breath.   Cardiovascular: Negative for chest pain.  Skin: Positive for color change and rash.  Neurological: Negative for weakness and numbness.    Physical Exam Updated Vital Signs BP 131/76 (BP Location: Left Arm)   Pulse 87   Temp 98.4 F (36.9 C) (Oral)   Resp 16   SpO2 96%   Physical Exam Vitals and nursing note reviewed. Exam conducted with a chaperone present.  Constitutional:       Appearance: Normal appearance.  HENT:     Head: Normocephalic and atraumatic.  Eyes:     Comments: PERRLA.  EOM intact.  No pain with EOMs.  Conjunctive is normal, not injected.  No significant eyelid crusting on examination.  No discharge.  No periorbital swelling or erythema.  Cardiovascular:     Rate and Rhythm: Normal rate and regular rhythm.     Pulses: Normal pulses.     Heart sounds: Normal heart sounds.  Pulmonary:     Effort: Pulmonary effort is normal. No respiratory distress.     Breath sounds: Normal breath sounds.  Musculoskeletal:     Cervical back: Normal range of motion. No rigidity.  Skin:    Comments: Dark, shimmery/silver appearing macular rash involving left side of neck.  No obvious scaling.  Negative [place].  Nontender.  Neurological:     Mental Status: She is alert.     GCS: GCS eye subscore is 4. GCS verbal subscore is 5. GCS motor subscore is 6.  Psychiatric:        Mood and Affect: Mood normal.        Behavior: Behavior normal.        Thought Content: Thought content normal.       ED Results / Procedures / Treatments   Labs (all labs ordered are listed, but only abnormal results are displayed) Labs Reviewed - No data to display  EKG None  Radiology No results found.  Procedures Procedures (including critical care time)  Medications Ordered in ED Medications - No data to display  ED Course  I have reviewed the triage vital signs and the nursing notes.  Pertinent labs & imaging results that were available during my care of the patient were reviewed by me and considered in my medical decision making (see chart for details).    MDM Rules/Calculators/A&P                      Patient describes this rash as itchy and "moist".  It is located on the left side of her neck and in intertriginous fold and suspect that this could be a fungal rash given her description.  However, not particularly scaly and physical exam is nonspecific.  There is a  silvery "shimmer" appearance, however not a classic presentation for psoriasis.  Will prescribe both 2% hydrocortisone cream in addition to clotrimazole ointment and encouraged her to mix them together before applying to the affected area twice daily.  Since she informed me that this is a moist rash, encouraging her to keep it dry.  I have also instructed her to acquire over-the-counter cetirizine or similar antihistamine to take regularly to help with her itching symptoms.  As for her reported eyelid "crusting" and dry eye symptoms x2 months, suspect blepharitis versus keratoconjunctivitis sicca.  Encouraging her to apply a warm, wet compress to the eye and hold it on for 5 minutes each morning.  I am also encouraging her to try a different over-the-counter artificial tears such as Systane.    While dry eyes in conjunction with her rashes seemingly concerning for Sjogren's syndrome, I discussed with patient plans to try and treat this conservatively with topical agents for her rash and warm compresses and alternative ocular lubricants for her eyelids and dry eye symptoms.  Patient agrees with this plan.  Her physical exam is entirely benign and these are chronic conditions.  She would most benefit from evaluation by her primary care provider.  Encouraged her to schedule appointment with her for ongoing evaluation and management.  Strict ED return precautions discussed.  Patient voices understanding and is agreeable to the plan.  An ASL translator was used during the duration of this encounter.  Final Clinical Impression(s) / ED Diagnoses Final diagnoses:  Rash  Keratoconjunctivitis sicca (HCC)    Rx / DC Orders ED Discharge Orders         Ordered    hydrocortisone 2.5 % lotion  2 times daily     01/10/20 2152    clotrimazole (LOTRIMIN) 1 % cream     01/10/20 2152           Lorelee New, PA-C 01/10/20 2155    Terald Sleeper, MD 01/11/20 [phone]

## 2020-01-10 NOTE — ED Triage Notes (Signed)
ASL interpreter used: Pt c/o rash to the left side of her neck x 3 months, as well as bilateral eye watering, redness and pain x 3 months. No known allergies.

## 2020-01-10 NOTE — ED Notes (Signed)
Pt declined to wait for eye drops, declined to allow vitals to be taken.  Pt reported through interpreter being in a hurry to return home to elderly mother.

## 2020-01-10 NOTE — ED Notes (Signed)
Called to request interpreter, left VM

## 2020-01-10 NOTE — Discharge Instructions (Addendum)
Please acquire cetirizine (Zyrtec) over-the-counter at a pharmacy to take daily to reduce your itching sensation.  I would also like for you to mix the clotrimazole and hydrocortisone lotions before applying to the affected area twice daily.  You will need to do this for the next couple of weeks or until your rash has improved.  I encourage you to apply a wet, warm compress to the eyelids each morning for approximately 5 minutes duration to help with your sticky eye sensation and crusting.  This could be suggestive of blepharitis.  I would also like for you to acquire Systane, over-the-counter, for use as your artificial tears.  Use them regularly.  Please follow-up with your primary care provider regarding today's encounter.  You will need to have reevaluation in the next week to ensure that your symptoms are improving.   Please return to the ED or seek immediate medical attention should you experience any new or worsening symptoms.

## 2020-01-10 NOTE — ED Notes (Signed)
Called secondary int. # for request, left VM

## 2020-01-18 ENCOUNTER — Emergency Department (HOSPITAL_COMMUNITY): Payer: Self-pay

## 2020-01-18 ENCOUNTER — Emergency Department (HOSPITAL_COMMUNITY)
Admission: EM | Admit: 2020-01-18 | Discharge: 2020-01-18 | Disposition: A | Discharge status: Home / Self Care | Payer: Self-pay | Attending: Emergency Medicine | Admitting: Emergency Medicine

## 2020-01-18 DIAGNOSIS — Z7982 Long term (current) use of aspirin: Secondary | ICD-10-CM | POA: Insufficient documentation

## 2020-01-18 DIAGNOSIS — F17210 Nicotine dependence, cigarettes, uncomplicated: Secondary | ICD-10-CM | POA: Insufficient documentation

## 2020-01-18 DIAGNOSIS — I10 Essential (primary) hypertension: Secondary | ICD-10-CM | POA: Insufficient documentation

## 2020-01-18 DIAGNOSIS — E119 Type 2 diabetes mellitus without complications: Secondary | ICD-10-CM | POA: Insufficient documentation

## 2020-01-18 DIAGNOSIS — Z79899 Other long term (current) drug therapy: Secondary | ICD-10-CM | POA: Insufficient documentation

## 2020-01-18 DIAGNOSIS — J40 Bronchitis, not specified as acute or chronic: Secondary | ICD-10-CM | POA: Insufficient documentation

## 2020-01-18 DIAGNOSIS — Z7984 Long term (current) use of oral hypoglycemic drugs: Secondary | ICD-10-CM | POA: Insufficient documentation

## 2020-01-18 LAB — COMPREHENSIVE METABOLIC PANEL
ALT: 16 U/L (ref 0–44)
AST: 18 U/L (ref 15–41)
Albumin: 3.7 g/dL (ref 3.5–5.0)
Alkaline Phosphatase: 70 U/L (ref 38–126)
Anion gap: 10 (ref 5–15)
BUN: 12 mg/dL (ref 8–23)
CO2: 24 mmol/L (ref 22–32)
Calcium: 9.3 mg/dL (ref 8.9–10.3)
Chloride: 106 mmol/L (ref 98–111)
Creatinine, Ser: 0.99 mg/dL (ref 0.44–1.00)
GFR calc Af Amer: >60 mL/min (ref >60)
GFR calc non Af Amer: 60 mL/min — ABNORMAL LOW (ref >60)
Glucose, Bld: 113 mg/dL — ABNORMAL HIGH (ref 70–99)
Potassium: 4.2 mmol/L (ref 3.5–5.1)
Sodium: 140 mmol/L (ref 135–145)
Total Bilirubin: 0.6 mg/dL (ref 0.3–1.2)
Total Protein: 6.9 g/dL (ref 6.5–8.1)

## 2020-01-18 LAB — CBC
HCT: 41.2 % (ref 36.0–46.0)
Hemoglobin: 13.2 g/dL (ref 12.0–15.0)
MCH: 32.4 pg (ref 26.0–34.0)
MCHC: 32 g/dL (ref 30.0–36.0)
MCV: 101.2 fL — ABNORMAL HIGH (ref 80.0–100.0)
Platelets: 294 10*3/uL (ref 150–400)
RBC: 4.07 MIL/uL (ref 3.87–5.11)
RDW: 11.9 % (ref 11.5–15.5)
WBC: 3.6 10*3/uL — ABNORMAL LOW (ref 4.0–10.5)
nRBC: 0 % (ref 0.0–0.2)

## 2020-01-18 MED ORDER — PREDNISONE 20 MG PO TABS
60.0000 mg | ORAL_TABLET | Freq: Once | ORAL | Status: AC
  Administered 2020-01-18: 60 mg via ORAL
  Filled 2020-01-18: qty 3

## 2020-01-18 MED ORDER — IPRATROPIUM BROMIDE HFA 17 MCG/ACT IN AERS
4.0000 | INHALATION_SPRAY | Freq: Once | RESPIRATORY_TRACT | Status: AC
  Administered 2020-01-18: 4 via RESPIRATORY_TRACT
  Filled 2020-01-18: qty 12.9

## 2020-01-18 MED ORDER — PREDNISONE 20 MG PO TABS: 40.0000 mg | ORAL_TABLET | Freq: Every day | ORAL | 0 refills | Status: DC

## 2020-01-18 MED ORDER — ALBUTEROL SULFATE HFA 108 (90 BASE) MCG/ACT IN AERS
8.0000 | INHALATION_SPRAY | Freq: Once | RESPIRATORY_TRACT | Status: AC
  Administered 2020-01-18: 8 via RESPIRATORY_TRACT
  Filled 2020-01-18: qty 6.7

## 2020-01-18 MED ORDER — AEROCHAMBER PLUS FLO-VU LARGE MISC: 1.0000 | Freq: Once | Status: DC

## 2020-01-18 NOTE — ED Provider Notes (Signed)
[facility] EMERGENCY DEPARTMENT Provider Note   CSN: [id number] Arrival date & time: 01/18/20  1007     History No chief complaint on file.   Katelyn Winters is a 64 y.o. female.  Pt is a 64y/o female with hx of DM, HTN, deaf and tobacco abuse presenting with 2 weeks of worsening cough.  Pt has had yellow sputum but no hemoptysis.  No fever, abd pain, CP, N/V/D.  Has not had any sick contacts and has been vaccinated against COVID.  Pt has had some exertional dyspnea and cough seems to be worse at night.  Pt does not have hx of COPD or asthma and does not use inhalers regularly.  She does c/o of nasal congestion as well but no facial pain.  The history is provided by the patient. The history is limited by a language barrier. A language interpreter was used.       Past Medical History:  Diagnosis Date  . Deaf   . Diabetes mellitus without complication (HCC)   . Hypertension     There are no problems to display for this patient.   Past Surgical History:  Procedure Laterality Date  . BLADDER SURGERY       OB History   No obstetric history on file.     No family history on file.  Social History   Tobacco Use  . Smoking status: Current Some Day Smoker    Types: Cigarettes  . Smokeless tobacco: Never Used  Substance Use Topics  . Alcohol use: Yes    Comment: occ  . Drug use: No    Home Medications Prior to Admission medications   Medication Sig Start Date End Date Taking? Authorizing Provider  clotrimazole (LOTRIMIN) 1 % cream Mix with the hydrocortisone cream before applying twice daily. 01/10/20   Lorelee New, PA-C  HYDROcodone-acetaminophen (NORCO/VICODIN) 5-325 MG tablet Take 1 tablet by mouth every 6 (six) hours as needed for moderate pain. Patient not taking: Reported on 09/19/2019 07/14/19   Mesner, Barbara Cower, MD  hydrocortisone 2.5 % lotion Apply topically 2 (two) times daily. Mix with the clotrimazole lotion before applying twice daily.  01/10/20   Lorelee New, PA-C  metFORMIN (GLUCOPHAGE) 500 MG tablet Take 1 tablet (500 mg total) by mouth 2 (two) times daily with a meal. 01/11/18   Terrilee Files, MD  Multiple Vitamin (MULTIVITAMIN WITH MINERALS) TABS tablet Take 1 tablet by mouth daily.    [provider]  Omega-3 Fatty Acids (FISH OIL PO) Take 50 mg by mouth daily.    [provider]  albuterol (PROVENTIL HFA;VENTOLIN HFA) 108 (90 Base) MCG/ACT inhaler Inhale 1-2 puffs into the lungs every 6 (six) hours as needed for wheezing or shortness of breath. Patient not taking: Reported on 12/23/2017 03/19/17 07/14/19  Glynn Octave, MD  dicyclomine (BENTYL) 20 MG tablet Take 1 tablet (20 mg total) by mouth 2 (two) times daily as needed (for abdominal cramping). Patient not taking: Reported on 12/23/2017 12/14/15 07/14/19  Antony Madura, PA-C    Allergies    Patient has no known allergies.  Review of Systems   Review of Systems  All other systems reviewed and are negative.   Physical Exam Updated Vital Signs BP 128/80 (BP Location: Left Arm)   Pulse 80   Temp 97.6 F (36.4 C) (Oral)   Resp 15   SpO2 96%   Physical Exam Vitals and nursing note reviewed.  Constitutional:      General: She  is not in acute distress.    Appearance: Normal appearance. She is well-developed.  HENT:     Head: Normocephalic and atraumatic.  Eyes:     Conjunctiva/sclera: Conjunctivae normal.     Pupils: Pupils are equal, round, and reactive to light.  Cardiovascular:     Rate and Rhythm: Normal rate and regular rhythm.     Pulses: Normal pulses.     Heart sounds: No murmur.  Pulmonary:     Effort: Pulmonary effort is normal. No respiratory distress.     Breath sounds: Wheezing present. No rales.     Comments: Diffuse expiratory wheezing Abdominal:     General: There is no distension.     Palpations: Abdomen is soft.     Tenderness: There is no abdominal tenderness. There is no guarding or rebound.    Musculoskeletal:        General: No tenderness. Normal range of motion.     Cervical back: Normal range of motion and neck supple.  Skin:    General: Skin is warm and dry.     Findings: No erythema or rash.  Neurological:     General: No focal deficit present.     Mental Status: She is alert and oriented to person, place, and time. Mental status is at baseline.  Psychiatric:        Mood and Affect: Mood normal.        Behavior: Behavior normal.        Thought Content: Thought content normal.     ED Results / Procedures / Treatments   Labs (all labs ordered are listed, but only abnormal results are displayed) Labs Reviewed  CBC - Abnormal; Notable for the following components:      Result Value   WBC 3.6 (*)    MCV 101.2 (*)    All other components within normal limits  COMPREHENSIVE METABOLIC PANEL - Abnormal; Notable for the following components:   Glucose, Bld 113 (*)    GFR calc non Af Amer 60 (*)    All other components within normal limits    EKG None  Radiology DG Chest 2 View  Result Date: 01/18/2020 CLINICAL DATA:  Wheezing and cough. EXAM: CHEST - 2 VIEW COMPARISON:  12/23/2017 FINDINGS: Stable scarring at the left base. There is no edema, consolidation, effusion, or pneumothorax. Normal heart size and mediastinal contours. Degenerative endplate spurring. Cholecystectomy clips. IMPRESSION: 1. No acute finding. 2. Stable lingular scarring. Electronically Signed   By: Monte Fantasia M.D.   On: 01/18/2020 10:41    Procedures Procedures (including critical care time)  Medications Ordered in ED Medications  albuterol (VENTOLIN HFA) 108 (90 Base) MCG/ACT inhaler 8 puff (has no administration in time range)  ipratropium (ATROVENT HFA) inhaler 4 puff (has no administration in time range)  predniSONE (DELTASONE) tablet 60 mg (has no administration in time range)  AeroChamber Plus Flo-Vu Medium MISC 1 each (has no administration in time range)    ED Course  I  have reviewed the triage vital signs and the nursing notes.  Pertinent labs & imaging results that were available during my care of the patient were reviewed by me and considered in my medical decision making (see chart for details).    MDM Rules/Calculators/A&P                      Pleasant 64y/o female with hx of tobacco use presenting with 2 weeks of worsening cough.  C/o of some  SOB.  Pt well appearing on exam but wheezing.  Vaccinated against COVID.  Pt sats 96% on RA with normal RR.  Low suspicion for COVID or acute cardiac pathology.  CXR wnl.  CBC, BMP without acute findings.  Concern for acute bronchitis.  Pt given albuterol/atrovent and prednisone.  Will reevaluate after treatments.  1:58 PM Wheezing resolved after initial treatment.  Will d/c home with steroids and inhalers.  MDM Number of Diagnoses or Management Options   Amount and/or Complexity of Data Reviewed Clinical lab tests: ordered and reviewed Tests in the radiology section of CPT: ordered and reviewed Decide to obtain previous medical records or to obtain history from someone other than the patient: no Obtain history from someone other than the patient: no Discuss the patient with other providers: no Independent visualization of images, tracings, or specimens: yes  Risk of Complications, Morbidity, and/or Mortality Presenting problems: moderate Diagnostic procedures: low Management options: low  Patient Progress Patient progress: improved    Final Clinical Impression(s) / ED Diagnoses Final diagnoses:  Bronchitis    Rx / DC Orders ED Discharge Orders         Ordered    predniSONE (DELTASONE) 20 MG tablet  Daily     01/18/20 1400           Gwyneth Sprout, MD 01/18/20 1400

## 2020-01-18 NOTE — ED Triage Notes (Signed)
Pt here with c/o cough and unable to sleep at night , pt is a smoker

## 2020-01-18 NOTE — Discharge Instructions (Signed)
Use the Proventil (albuterol) inhaler 2 puffs every 4-6 hours as needed for wheezing or coughing. Use the Atrovent inhaler 2 puffs 1 time in the morning and 1 time at night for the next 2 days and then stop.   You will take the prednisone for the next 5 days.

## 2020-01-19 ENCOUNTER — Emergency Department (HOSPITAL_COMMUNITY)
Admission: EM | Admit: 2020-01-19 | Discharge: 2020-01-20 | Disposition: A | Discharge status: Home / Self Care | Payer: Self-pay | Attending: Emergency Medicine | Admitting: Emergency Medicine

## 2020-01-19 ENCOUNTER — Encounter (HOSPITAL_COMMUNITY): Payer: Self-pay

## 2020-01-19 DIAGNOSIS — Z7984 Long term (current) use of oral hypoglycemic drugs: Secondary | ICD-10-CM | POA: Insufficient documentation

## 2020-01-19 DIAGNOSIS — F17210 Nicotine dependence, cigarettes, uncomplicated: Secondary | ICD-10-CM | POA: Insufficient documentation

## 2020-01-19 DIAGNOSIS — E119 Type 2 diabetes mellitus without complications: Secondary | ICD-10-CM | POA: Insufficient documentation

## 2020-01-19 DIAGNOSIS — I10 Essential (primary) hypertension: Secondary | ICD-10-CM | POA: Insufficient documentation

## 2020-01-19 DIAGNOSIS — J40 Bronchitis, not specified as acute or chronic: Secondary | ICD-10-CM | POA: Insufficient documentation

## 2020-01-19 DIAGNOSIS — Z79899 Other long term (current) drug therapy: Secondary | ICD-10-CM | POA: Insufficient documentation

## 2020-01-19 MED ORDER — IPRATROPIUM BROMIDE HFA 17 MCG/ACT IN AERS
2.0000 | INHALATION_SPRAY | Freq: Once | RESPIRATORY_TRACT | Status: AC
  Administered 2020-01-20: 2 via RESPIRATORY_TRACT
  Filled 2020-01-19: qty 12.9

## 2020-01-19 MED ORDER — ALBUTEROL SULFATE HFA 108 (90 BASE) MCG/ACT IN AERS
6.0000 | INHALATION_SPRAY | Freq: Once | RESPIRATORY_TRACT | Status: AC
  Administered 2020-01-20: 6 via RESPIRATORY_TRACT
  Filled 2020-01-19: qty 6.7

## 2020-01-19 MED ORDER — AEROCHAMBER Z-STAT PLUS/MEDIUM MISC
1.0000 | Freq: Once | Status: AC
  Administered 2020-01-20: 1
  Filled 2020-01-19: qty 1

## 2020-01-19 NOTE — ED Triage Notes (Signed)
Used ASL interpreter [phone]. Pt is hearing impaired.  Pt reports continued cough after using prednisone and albuterol. She was seen for same yesterday. Denies fever. States that chest is sore when coughing. A&Ox4.

## 2020-01-19 NOTE — ED Provider Notes (Signed)
[facility]-EMERGENCY DEPT Provider Note  CSN: [id number] Arrival date & time: 01/19/20 1905  Chief Complaint(s) Cough  HPI Katelyn Winters is a 64 y.o. female with a past medical history listed below seen here yesterday for cough and diagnosed with bronchitis.  Patient was given albuterol and Atrovent puffs.  Returns for persistent coughing with associated chest and upper back pain related to the coughing.  She denies any fevers.  She is endorsing increased sputum production.  No other physical complaints.  The history is limited by a language barrier. A language interpreter was used.    Past Medical History Past Medical History:  Diagnosis Date  . Deaf   . Diabetes mellitus without complication (HCC)   . Hypertension    There are no problems to display for this patient.  Home Medication(s) Prior to Admission medications   Medication Sig Start Date End Date Taking? Authorizing Provider  azithromycin (ZITHROMAX Z-PAK) 250 MG tablet Take 500 mg on day 1, then 250 mg once a day for days 2, 3, 4, and 5. 01/20/20   Liesa Tsan, Amadeo Garnet, MD  clotrimazole (LOTRIMIN) 1 % cream Mix with the hydrocortisone cream before applying twice daily. 01/10/20   Lorelee New, PA-C  HYDROcodone-acetaminophen (NORCO/VICODIN) 5-325 MG tablet Take 1 tablet by mouth every 6 (six) hours as needed for moderate pain. Patient not taking: Reported on 09/19/2019 07/14/19   Mesner, Barbara Cower, MD  hydrocortisone 2.5 % lotion Apply topically 2 (two) times daily. Mix with the clotrimazole lotion before applying twice daily. 01/10/20   Lorelee New, PA-C  metFORMIN (GLUCOPHAGE) 500 MG tablet Take 1 tablet (500 mg total) by mouth 2 (two) times daily with a meal. 01/11/18   Terrilee Files, MD  Multiple Vitamin (MULTIVITAMIN WITH MINERALS) TABS tablet Take 1 tablet by mouth daily.    [provider]  Omega-3 Fatty Acids (FISH OIL PO) Take 50 mg by mouth daily.    [provider]    predniSONE (DELTASONE) 20 MG tablet Take 2 tablets (40 mg total) by mouth daily. 01/18/20   Gwyneth Sprout, MD  albuterol (PROVENTIL HFA;VENTOLIN HFA) 108 (90 Base) MCG/ACT inhaler Inhale 1-2 puffs into the lungs every 6 (six) hours as needed for wheezing or shortness of breath. Patient not taking: Reported on 12/23/2017 03/19/17 07/14/19  Glynn Octave, MD  dicyclomine (BENTYL) 20 MG tablet Take 1 tablet (20 mg total) by mouth 2 (two) times daily as needed (for abdominal cramping). Patient not taking: Reported on 12/23/2017 12/14/15 07/14/19  Antony Madura, PA-C                                                                                                                                    Past Surgical History Past Surgical History:  Procedure Laterality Date  . BLADDER SURGERY     Family History History reviewed. No pertinent family history.  Social History Social History  Tobacco Use  . Smoking status: Current Some Day Smoker    Types: Cigarettes  . Smokeless tobacco: Never Used  Substance Use Topics  . Alcohol use: Yes    Comment: occ  . Drug use: No   Allergies Patient has no known allergies.  Review of Systems Review of Systems All other systems are reviewed and are negative for acute change except as noted in the HPI  Physical Exam Vital Signs  I have reviewed the triage vital signs BP (!) 154/77 (BP Location: Left Arm)   Pulse 78   Temp 98.2 F (36.8 C) (Oral)   Resp 18   SpO2 95%   Physical Exam Vitals reviewed.  Constitutional:      General: She is not in acute distress.    Appearance: She is well-developed. She is not diaphoretic.  HENT:     Head: Normocephalic and atraumatic.     Nose: Nose normal.  Eyes:     General: No scleral icterus.       Right eye: No discharge.        Left eye: No discharge.     Conjunctiva/sclera: Conjunctivae normal.     Pupils: Pupils are equal, round, and reactive to light.  Cardiovascular:     Rate and Rhythm:  Normal rate and regular rhythm.     Heart sounds: No murmur. No friction rub. No gallop.   Pulmonary:     Effort: Pulmonary effort is normal. No respiratory distress.     Breath sounds: No stridor. Wheezing present. No rales.  Abdominal:     General: There is no distension.     Palpations: Abdomen is soft.     Tenderness: There is no abdominal tenderness.  Musculoskeletal:        General: No tenderness.     Cervical back: Normal range of motion and neck supple.  Skin:    General: Skin is warm and dry.     Findings: No erythema or rash.  Neurological:     Mental Status: She is alert and oriented to person, place, and time.     ED Results and Treatments Labs (all labs ordered are listed, but only abnormal results are displayed) Labs Reviewed - No data to display                                                                                                                       EKG  EKG Interpretation  Date/Time:    Ventricular Rate:    PR Interval:    QRS Duration:   QT Interval:    QTC Calculation:   R Axis:     Text Interpretation:        Radiology No results found.  Pertinent labs & imaging results that were available during my care of the patient were reviewed by me and considered in my medical decision making (see chart for details).  Medications Ordered in ED Medications  albuterol (VENTOLIN HFA) 108 (90 Base) MCG/ACT inhaler 6  puff (6 puffs Inhalation Given 01/20/20 0011)  ipratropium (ATROVENT HFA) inhaler 2 puff (2 puffs Inhalation Given 01/20/20 0011)  aerochamber Z-Stat Plus/medium 1 each (1 each Other Given 01/20/20 0010)  predniSONE (DELTASONE) tablet 40 mg (40 mg Oral Given 01/20/20 0044)                                                                                                                                    Procedures Procedures  (including critical care time)  Medical Decision Making / ED Course I have reviewed the nursing notes for this  encounter and the patient's prior records (if available in EHR or on provided paperwork).   Katelyn Winters was evaluated in Emergency Department on 01/20/2020 for the symptoms described in the history of present illness. She was evaluated in the context of the global COVID-19 pandemic, which necessitated consideration that the patient might be at risk for infection with the SARS-CoV-2 virus that causes COVID-19. Institutional protocols and algorithms that pertain to the evaluation of patients at risk for COVID-19 are in a state of rapid change based on information released by regulatory bodies including the CDC and federal and state organizations. These policies and algorithms were followed during the patient's care in the ED.  Consistent with bronchitis. CXR yesterday w/o PNA.  Treated with albuterol and atrovent puffs. Oral prednisone. Improved.  No need for repeat CXR.  Patient reports financial restraints, but can afford Zpak. Will Rx given increased sputum production in setting of chronic smoking history.      Final Clinical Impression(s) / ED Diagnoses Final diagnoses:  Bronchitis   The patient appears reasonably screened and/or stabilized for discharge and I doubt any other medical condition or other [facility] requiring further screening, evaluation, or treatment in the ED at this time prior to discharge. Safe for discharge with strict return precautions.  Disposition: Discharge  Condition: Good  I have discussed the results, Dx and Tx plan with the patient/family who expressed understanding and agree(s) with the plan. Discharge instructions discussed at length. The patient/family was given strict return precautions who verbalized understanding of the instructions. No further questions at time of discharge.    ED Discharge Orders         Ordered    azithromycin (ZITHROMAX Z-PAK) 250 MG tablet     01/20/20 0041            Follow Up: Morrell Riddle, PA-C [address] [place] [address] [phone]  Schedule an appointment as soon as possible for a visit  If symptoms do not improve or  worsen      This chart was dictated using voice recognition software.  Despite best efforts to proofread,  errors can occur which can change the documentation meaning.   Nira Conn, MD 01/20/20 [phone]

## 2020-01-20 ENCOUNTER — Other Ambulatory Visit: Payer: Self-pay

## 2020-01-20 MED ORDER — AZITHROMYCIN 250 MG PO TABS: ORAL_TABLET | ORAL | 0 refills | Status: DC

## 2020-01-20 MED ORDER — PREDNISONE 20 MG PO TABS
40.0000 mg | ORAL_TABLET | Freq: Once | ORAL | Status: AC
  Administered 2020-01-20: 40 mg via ORAL
  Filled 2020-01-20: qty 2

## 2020-07-20 ENCOUNTER — Other Ambulatory Visit: Payer: Self-pay

## 2020-07-20 ENCOUNTER — Encounter (HOSPITAL_COMMUNITY): Payer: Self-pay | Admitting: Emergency Medicine

## 2020-07-20 ENCOUNTER — Emergency Department (HOSPITAL_COMMUNITY)
Admission: EM | Admit: 2020-07-20 | Discharge: 2020-07-20 | Disposition: A | Discharge status: Home / Self Care | Payer: Self-pay | Attending: Emergency Medicine | Admitting: Emergency Medicine

## 2020-07-20 ENCOUNTER — Emergency Department (HOSPITAL_COMMUNITY): Payer: Self-pay

## 2020-07-20 DIAGNOSIS — F17210 Nicotine dependence, cigarettes, uncomplicated: Secondary | ICD-10-CM | POA: Insufficient documentation

## 2020-07-20 DIAGNOSIS — Y990 Civilian activity done for income or pay: Secondary | ICD-10-CM | POA: Insufficient documentation

## 2020-07-20 DIAGNOSIS — W19XXXA Unspecified fall, initial encounter: Secondary | ICD-10-CM

## 2020-07-20 DIAGNOSIS — W010XXA Fall on same level from slipping, tripping and stumbling without subsequent striking against object, initial encounter: Secondary | ICD-10-CM | POA: Insufficient documentation

## 2020-07-20 DIAGNOSIS — M25552 Pain in left hip: Secondary | ICD-10-CM | POA: Insufficient documentation

## 2020-07-20 DIAGNOSIS — I10 Essential (primary) hypertension: Secondary | ICD-10-CM | POA: Insufficient documentation

## 2020-07-20 DIAGNOSIS — E119 Type 2 diabetes mellitus without complications: Secondary | ICD-10-CM | POA: Insufficient documentation

## 2020-07-20 DIAGNOSIS — M25562 Pain in left knee: Secondary | ICD-10-CM | POA: Insufficient documentation

## 2020-07-20 DIAGNOSIS — M545 Low back pain, unspecified: Secondary | ICD-10-CM | POA: Insufficient documentation

## 2020-07-20 DIAGNOSIS — Z7984 Long term (current) use of oral hypoglycemic drugs: Secondary | ICD-10-CM | POA: Insufficient documentation

## 2020-07-20 DIAGNOSIS — M25512 Pain in left shoulder: Secondary | ICD-10-CM | POA: Insufficient documentation

## 2020-07-20 MED ORDER — ACETAMINOPHEN 500 MG PO TABS: 1000.0000 mg | ORAL_TABLET | Freq: Four times a day (QID) | ORAL | 0 refills | Status: AC | PRN

## 2020-07-20 MED ORDER — LIDOCAINE 5 % EX PTCH
2.0000 | MEDICATED_PATCH | Freq: Once | CUTANEOUS | Status: DC
  Administered 2020-07-20: 2 via TRANSDERMAL
  Filled 2020-07-20: qty 2

## 2020-07-20 MED ORDER — KETOROLAC TROMETHAMINE 30 MG/ML IJ SOLN
30.0000 mg | Freq: Once | INTRAMUSCULAR | Status: AC
  Administered 2020-07-20: 30 mg via INTRAMUSCULAR
  Filled 2020-07-20: qty 1

## 2020-07-20 NOTE — ED Notes (Signed)
Pt ambulated around the nurses station without difficulty. Steady gait

## 2020-07-20 NOTE — ED Notes (Signed)
Patient returned from xray.

## 2020-07-20 NOTE — ED Provider Notes (Signed)
[facility]   CSN: [id number] Arrival date & time: 07/20/20  [id number]     History Chief Complaint  Patient presents with  . Fall  . Back Pain  . Shoulder Pain    Katelyn Winters is a 64 y.o. female.  Katelyn Winters is a 64 y.o. female who is deaf with history of hypertension, hyperlipidemia, and diabetes, who presents to the emergency department after she had a mechanical fall at work yesterday.  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  She denies hitting her head, no LOC.  She is complaining of pain primarily in her left shoulder, left low back and hip and some mild pain in her left knee as well.  She initially went home and felt okay but reports pain was worsening last night and affecting her sleep so she presents for evaluation.  Denies any neck pain.  No pain in her chest or abdomen.  No shortness of breath.  States she has been able to walk but it is painful and she has difficulty getting up from sitting due to the pain in her back and hip.  She reports that this was the worst pain since the fall.  She denies any numbness weakness or tingling in her extremities.  Has not noticed significant swelling.  No bruising or discoloration noted.  She has not taken anything for her pain prior to arrival, and is requesting a shot to help with her pain.  Denies history of previous injury or surgery to these joints.  No blood thinners.  No other aggravating or alleviating factor.  The history is provided by the patient. The history is limited by a language barrier. A language interpreter was used (ASL).       Past Medical History:  Diagnosis Date  . Deaf   . Diabetes mellitus without complication (HCC)   . Hypertension     There are no problems to display for this patient.   Past Surgical History:  Procedure Laterality Date  . BLADDER SURGERY       OB History   No obstetric  history on file.     No family history on file.  Social History   Tobacco Use  . Smoking status: Current Some Day Smoker    Types: Cigarettes  . Smokeless tobacco: Never Used  Vaping Use  . Vaping Use: Never used  Substance Use Topics  . Alcohol use: Yes    Comment: occ  . Drug use: No    Home Medications Prior to Admission medications   Medication Sig Start Date End Date Taking? Authorizing Provider  acetaminophen (TYLENOL) 500 MG tablet Take 2 tablets (1,000 mg total) by mouth every 6 (six) hours as needed for up to 10 days. 07/20/20 07/30/20  Dartha LodgeFord, [place] Vanaken N, PA-C  azithromycin (ZITHROMAX Z-PAK) 250 MG tablet Take 500 mg on day 1, then 250 mg once a day for days 2, 3, 4, and 5. 01/20/20   Cardama, Amadeo GarnetPedro Eduardo, MD  clotrimazole (LOTRIMIN) 1 % cream Mix with the hydrocortisone cream before applying twice daily. 01/10/20   Lorelee NewGreen, Garrett L, PA-C  hydrocortisone 2.5 % lotion Apply topically 2 (two) times daily. Mix with the clotrimazole lotion before applying twice daily. 01/10/20   Lorelee NewGreen, Garrett L, PA-C  metFORMIN (GLUCOPHAGE) 500 MG tablet Take 1 tablet (500 mg total) by mouth 2 (two) times daily with a meal. 01/11/18  Terrilee Files, MD  Multiple Vitamin (MULTIVITAMIN WITH MINERALS) TABS tablet Take 1 tablet by mouth daily.    [provider]  Omega-3 Fatty Acids (FISH OIL PO) Take 50 mg by mouth daily.    [provider]  predniSONE (DELTASONE) 20 MG tablet Take 2 tablets (40 mg total) by mouth daily. 01/18/20   Gwyneth Sprout, MD  albuterol (PROVENTIL HFA;VENTOLIN HFA) 108 (90 Base) MCG/ACT inhaler Inhale 1-2 puffs into the lungs every 6 (six) hours as needed for wheezing or shortness of breath. Patient not taking: Reported on 12/23/2017 03/19/17 07/14/19  Glynn Octave, MD  dicyclomine (BENTYL) 20 MG tablet Take 1 tablet (20 mg total) by mouth 2 (two) times daily as needed (for abdominal cramping). Patient not taking: Reported on 12/23/2017 12/14/15  07/14/19  Antony Madura, PA-C    Allergies    Patient has no known allergies.  Review of Systems   Review of Systems  Constitutional: Negative for chills and fever.  HENT: Negative.   Respiratory: Negative for cough and shortness of breath.   Cardiovascular: Negative for chest pain.  Gastrointestinal: Negative for abdominal pain, nausea and vomiting.  Genitourinary: Negative for dysuria and hematuria.  Musculoskeletal: Positive for arthralgias, back pain and myalgias. Negative for joint swelling and neck pain.  Skin: Negative for color change and rash.  Neurological: Negative for dizziness, syncope, weakness, light-headedness, numbness and headaches.  All other systems reviewed and are negative.   Physical Exam Updated Vital Signs BP 114/70   Pulse 64   Temp 98.4 F (36.9 C) (Oral)   Resp 18   SpO2 100%   Physical Exam Vitals and nursing note reviewed.  Constitutional:      General: She is not in acute distress.    Appearance: Normal appearance. She is well-developed. She is not ill-appearing or diaphoretic.     Comments: Alert, well-appearing and in no distress  HENT:     Head: Normocephalic and atraumatic.  Eyes:     General:        Right eye: No discharge.        Left eye: No discharge.     Pupils: Pupils are equal, round, and reactive to light.  Cardiovascular:     Rate and Rhythm: Normal rate and regular rhythm.     Heart sounds: Normal heart sounds. No murmur heard.  No friction rub. No gallop.   Pulmonary:     Effort: Pulmonary effort is normal. No respiratory distress.     Breath sounds: Normal breath sounds. No wheezing or rales.     Comments: Chest wall nontender, no ecchymosis noted Chest:     Chest wall: No tenderness.  Abdominal:     General: Bowel sounds are normal. There is no distension.     Palpations: Abdomen is soft. There is no mass.     Tenderness: There is no abdominal tenderness. There is no guarding.     Comments: Abdomen soft, nontender,  nondistended  Musculoskeletal:        General: Tenderness present. No deformity.     Cervical back: Neck supple.     Comments: Tenderness over the left shoulder without significant deformity or swelling, pain worse with range of motion, but active and passive range of motion intact.  No pain at the elbow or wrist.  Distal pulses 2+ Tenderness over the left low back musculature extending into the posterior hip, no ecchymosis or swelling noted, no rotation or shortening.  Pain worse with hip range of motion.  There is also some mild tenderness to the anterior knee with very slight swelling noted, no bruising or skin changes, no pain at the ankle.  Distal pulses 2+.  Skin:    General: Skin is warm and dry.     Capillary Refill: Capillary refill takes less than 2 seconds.  Neurological:     Mental Status: She is alert.     Coordination: Coordination normal.     Comments: Speech is clear, able to follow commands CN III-XII intact Normal strength in upper and lower extremities bilaterally including dorsiflexion and plantar flexion, strong and equal grip strength Sensation normal to light and sharp touch Moves extremities without ataxia, coordination intact  Psychiatric:        Mood and Affect: Mood normal.        Behavior: Behavior normal.     ED Results / Procedures / Treatments   Labs (all labs ordered are listed, but only abnormal results are displayed) Labs Reviewed - No data to display  EKG None  Radiology DG Lumbar Spine Complete  Addendum Date: 07/20/2020   ADDENDUM REPORT: 07/20/2020 09:29 ADDENDUM: There is a dictation error within the impression of the report. The first impression should read: NO lumbar compression deformity. This addendum was discussed by telephone at the time of interpretation on 07/20/2020 at 9:29 am to provider [facility] , who verbally acknowledged these results. Electronically Signed   By: Jackey Loge DO   On: 07/20/2020 09:29   Result Date:  07/20/2020 CLINICAL DATA:  Fall. Additional history provided: Left lower back/left lateral and posterior hip pain. EXAM: LUMBAR SPINE - COMPLETE 4+ VIEW COMPARISON:  CT abdomen/pelvis 07/13/2019. FINDINGS: Five lumbar vertebrae parenchymal relating with prior CT abdomen/pelvis 07/13/2019. 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 Lumbar levocurvature. Trace T12-L1, L1-L2, L2-L3 grade 1 retrolisthesis. Vertebral body height is maintained. No more than mild disc space narrowing at any level. Mild multilevel facet arthrosis. Surgical clips within the right upper quadrant of the abdomen. IMPRESSION: Lumbar compression deformity. Lumbar levocurvature. Trace T12-L1, L1-L2 and L2-L3 grade 1 retrolisthesis. Mild lumbar spondylosis. 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 EXAM: LEFT SHOULDER - 2+ VIEW COMPARISON:  None. FINDINGS: There is no evidence of fracture or dislocation. Acromioclavicular and posterior glenohumeral spurring. Degenerative thoracic endplate spurring. IMPRESSION: No acute finding. Electronically Signed   By: Marnee Spring M.D.   On: 07/20/2020 09:16   DG Knee Complete 4 Views Left  Result Date: 07/20/2020 CLINICAL DATA:  Fall, fell at work with LEFT low back and lateral posterior hip and shoulder pain EXAM: LEFT KNEE - COMPLETE 4+ VIEW COMPARISON:  Nine FINDINGS: Degenerative changes about the knee, tricompartmental degenerative changes moderate severity perhaps worse in the patellofemoral compartment. No sign of fracture or dislocation. No joint effusion. Soft tissues are unremarkable. IMPRESSION: Degenerative changes about the knee. No acute fracture or dislocation. Electronically Signed   By: Donzetta Kohut M.D.   On: 07/20/2020 09:18   DG Hip Unilat W or Wo Pelvis 2-3 Views Left  Result Date: 07/20/2020 CLINICAL DATA:  Fall. EXAM: DG HIP (WITH OR WITHOUT PELVIS) 2-3V LEFT  COMPARISON:  07/20/2020. FINDINGS: Degenerative changes lumbar spine and both hips. No acute bony abnormality identified. No evidence of fracture. No evidence of dislocation. IMPRESSION: Degenerative changes lumbar spine and both hips. No acute abnormality. Electronically Signed   By: Maisie Fus  Register  On: 07/20/2020 09:18    Procedures Procedures (including critical care time)  Medications Ordered in ED Medications  lidocaine (LIDODERM) 5 % 2 patch (2 patches Transdermal Patch Applied 07/20/20 0809)  ketorolac (TORADOL) 30 MG/ML injection 30 mg (30 mg Intramuscular Given 07/20/20 0809)    ED Course  I have reviewed the triage vital signs and the nursing notes.  Pertinent labs & imaging results that were available during my care of the patient were reviewed by me and considered in my medical decision making (see chart for details).    MDM Rules/Calculators/A&P                          64 year old female presents after mechanical fall while at work yesterday, slipping on water and causing her to fall onto her left side, no head injury, no LOC.  No blood thinners.  Pain primarily in the left shoulder, left low back and hip and some mild pain to the left knee, pain worsened overnight, and ambulation has been more painful today.  No midline C-spine tenderness.  No tenderness over the chest or abdomen.  No ecchymosis or signs of significant deformity on exam.  Will get plain films of the left shoulder, left hip, lumbar spine and left knee.  Pain treated here in the emergency department while awaiting imaging.  History obtained using ASL interpreter.  X-rays reviewed.  Shoulder films show no evidence of fracture or dislocation.  Films of the left knee, hip and lumbar spine show some degenerative changes but no signs of acute fracture or deformity.  On reevaluation patient reports improvement in her pain and she was able to ambulate on the left hip without difficulty.  Using ASL interpreter  discussed x-ray results.  Patient is feeling much better.  Will keep her out of work for a few days to give her time to recover, discussed medications and supportive treatments at home as well as return precautions and outpatient follow-up.  Patient expresses understanding and agreement.  All questions answered.  Discharged home in good condition.   Final Clinical Impression(s) / ED Diagnoses Final diagnoses:  Fall, initial encounter  Acute pain of left shoulder  Left hip pain  Acute left-sided low back pain without sciatica  Acute pain of left knee    Rx / DC Orders ED Discharge Orders         Ordered    acetaminophen (TYLENOL) 500 MG tablet  Every 6 hours PRN        07/20/20 1036           Jodi Geralds [place], [place] 07/20/20 1038    Terald Sleeper, MD 07/21/20 1108

## 2020-07-20 NOTE — ED Notes (Signed)
Patient transported to x-ray. ?

## 2020-07-20 NOTE — ED Notes (Signed)
ED secretary made aware of need for in person SL interpretor.

## 2020-07-20 NOTE — ED Triage Notes (Signed)
Utilized ASL interpretor to obtain triage history. 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 C/o Lower back/left hip and L shoulder pain. Active ROM with L shoulder but c/o difficulty ambulating and getting up when sitting.

## 2020-07-20 NOTE — Discharge Instructions (Signed)
Your evaluation today was very reassuring, x-rays do not show any signs of fracture, likely soft tissue injury and soreness from fall, this should improve over time.  You can apply ice and heat to help with pain.  You can continue to use over-the-counter salon pas lidocaine patches.  These should be applied for 12 hours and then remove for 12 hours.  You can also use Tylenol at 1000 mg every 6 hours, you should not take more than 4000 mg in total each day.  Please follow-up with your primary care provider if symptoms or not improving.

## 2020-10-14 ENCOUNTER — Emergency Department (HOSPITAL_COMMUNITY): Payer: Medicaid - Out of State

## 2020-10-14 ENCOUNTER — Emergency Department (HOSPITAL_COMMUNITY)
Admission: EM | Admit: 2020-10-14 | Discharge: 2020-10-14 | Disposition: A | Discharge status: Home / Self Care | Payer: Medicaid - Out of State | Attending: Emergency Medicine | Admitting: Emergency Medicine

## 2020-10-14 ENCOUNTER — Other Ambulatory Visit: Payer: Self-pay

## 2020-10-14 ENCOUNTER — Encounter (HOSPITAL_COMMUNITY): Payer: Self-pay | Admitting: Emergency Medicine

## 2020-10-14 DIAGNOSIS — E119 Type 2 diabetes mellitus without complications: Secondary | ICD-10-CM | POA: Insufficient documentation

## 2020-10-14 DIAGNOSIS — Z7984 Long term (current) use of oral hypoglycemic drugs: Secondary | ICD-10-CM | POA: Diagnosis not present

## 2020-10-14 DIAGNOSIS — R0789 Other chest pain: Secondary | ICD-10-CM

## 2020-10-14 DIAGNOSIS — I10 Essential (primary) hypertension: Secondary | ICD-10-CM | POA: Insufficient documentation

## 2020-10-14 DIAGNOSIS — R55 Syncope and collapse: Secondary | ICD-10-CM

## 2020-10-14 DIAGNOSIS — F17210 Nicotine dependence, cigarettes, uncomplicated: Secondary | ICD-10-CM | POA: Insufficient documentation

## 2020-10-14 DIAGNOSIS — M545 Low back pain, unspecified: Secondary | ICD-10-CM | POA: Diagnosis not present

## 2020-10-14 DIAGNOSIS — R072 Precordial pain: Secondary | ICD-10-CM | POA: Diagnosis not present

## 2020-10-14 DIAGNOSIS — R519 Headache, unspecified: Secondary | ICD-10-CM | POA: Diagnosis not present

## 2020-10-14 LAB — CBC
HCT: 41.7 % (ref 36.0–46.0)
Hemoglobin: 13.5 g/dL (ref 12.0–15.0)
MCH: 33.8 pg (ref 26.0–34.0)
MCHC: 32.4 g/dL (ref 30.0–36.0)
MCV: 104.5 fL — ABNORMAL HIGH (ref 80.0–100.0)
Platelets: 262 10*3/uL (ref 150–400)
RBC: 3.99 MIL/uL (ref 3.87–5.11)
RDW: 12 % (ref 11.5–15.5)
WBC: 4.6 10*3/uL (ref 4.0–10.5)
nRBC: 0 % (ref 0.0–0.2)

## 2020-10-14 LAB — BASIC METABOLIC PANEL
Anion gap: 11 (ref 5–15)
BUN: 10 mg/dL (ref 8–23)
CO2: 26 mmol/L (ref 22–32)
Calcium: 9.3 mg/dL (ref 8.9–10.3)
Chloride: 102 mmol/L (ref 98–111)
Creatinine, Ser: 0.83 mg/dL (ref 0.44–1.00)
GFR, Estimated: >60 mL/min (ref >60)
Glucose, Bld: 110 mg/dL — ABNORMAL HIGH (ref 70–99)
Potassium: 3.9 mmol/L (ref 3.5–5.1)
Sodium: 139 mmol/L (ref 135–145)

## 2020-10-14 LAB — URINALYSIS, ROUTINE W REFLEX MICROSCOPIC
Bilirubin Urine: NEGATIVE
Glucose, UA: NEGATIVE mg/dL
Hgb urine dipstick: NEGATIVE
Ketones, ur: NEGATIVE mg/dL
Nitrite: NEGATIVE
Protein, ur: NEGATIVE mg/dL
Specific Gravity, Urine: 1.015 (ref 1.005–1.030)
pH: 6 (ref 5.0–8.0)

## 2020-10-14 LAB — URINALYSIS, MICROSCOPIC (REFLEX): Bacteria, UA: NONE SEEN

## 2020-10-14 LAB — HEPATIC FUNCTION PANEL
ALT: 14 U/L (ref 0–44)
AST: 21 U/L (ref 15–41)
Albumin: 3.9 g/dL (ref 3.5–5.0)
Alkaline Phosphatase: 64 U/L (ref 38–126)
Bilirubin, Direct: 0.1 mg/dL (ref 0.0–0.2)
Indirect Bilirubin: 0.8 mg/dL (ref 0.3–0.9)
Total Bilirubin: 0.9 mg/dL (ref 0.3–1.2)
Total Protein: 7.1 g/dL (ref 6.5–8.1)

## 2020-10-14 LAB — TROPONIN I (HIGH SENSITIVITY)
Troponin I (High Sensitivity): 2 ng/L (ref <18)
Troponin I (High Sensitivity): <2 ng/L (ref <18)

## 2020-10-14 MED ORDER — PROCHLORPERAZINE MALEATE 5 MG PO TABS
10.0000 mg | ORAL_TABLET | Freq: Once | ORAL | Status: AC
  Administered 2020-10-14: 10 mg via ORAL
  Filled 2020-10-14: qty 2

## 2020-10-14 MED ORDER — KETOROLAC TROMETHAMINE 15 MG/ML IJ SOLN
15.0000 mg | Freq: Once | INTRAMUSCULAR | Status: AC
  Administered 2020-10-14: 15 mg via INTRAMUSCULAR
  Filled 2020-10-14: qty 1

## 2020-10-14 NOTE — ED Notes (Signed)
Called to main lab to add on Hepatic function panel.

## 2020-10-14 NOTE — ED Notes (Signed)
Patient tolerating water and [place] sandwich bag well with great appetite.

## 2020-10-14 NOTE — ED Provider Notes (Signed)
[facility] EMERGENCY DEPARTMENT Provider Note   CSN: [id number] Arrival date & time: 10/14/20  0855     History Chief Complaint  Patient presents with  . Chest Pain  . syncopal episode    Katelyn Winters is a 65 y.o. female.  65 y.o female with a PMH of HTN,DM, hearing impaired presents to the ED with a chief complaint of chest pain and syncope x 2 hours ago.  Patient describes the pain as substernal, sharp, with radiation to her left shoulder that came on suddenly while awaking this morning.  Reports pain has not resolved, however when pain was present she felt dizzy and had a syncopal episode.  She did strike her head and lost consciousness, does report pain around her right forehead, low back.  Reports she was picked up by her roommate.  She has taken Tylenol without any improvement in her headache.  She reports prior history of cardiac intervention, although later she denies no prior catheterization.  She did have a similar episode in the past, where she was seeing add another facility.  She does report a prior echo.  She reports compliance to her medication.  Dates that dizziness has now resolved, symptoms have subsided aside from the head pain after the hall.  There is no shortness of breath, fever, weakness.  Prior history of blood clots.  Seen by her PCP in the month of December.   The history is provided by the patient. A language interpreter was used.  Chest Pain Pain location:  Substernal area Pain quality comment:  Unknown Pain radiates to:  L shoulder Onset quality:  Sudden Duration:  2 hours Progression:  Resolved Chronicity:  New Context: at rest   Associated symptoms: back pain, dizziness and near-syncope   Associated symptoms: no abdominal pain, no altered mental status, no fever, no nausea, no numbness, no shortness of breath, no vomiting and no weakness   Risk factors: diabetes mellitus, high cholesterol and hypertension   Risk factors: no  coronary artery disease, not female, no prior DVT/PE and no smoking        Past Medical History:  Diagnosis Date  . Deaf   . Diabetes mellitus without complication (HCC)   . Hypertension     There are no problems to display for this patient.   Past Surgical History:  Procedure Laterality Date  . BLADDER SURGERY       OB History   No obstetric history on file.     No family history on file.  Social History   Tobacco Use  . Smoking status: Current Some Day Smoker    Types: Cigarettes  . Smokeless tobacco: Never Used  Vaping Use  . Vaping Use: Never used  Substance Use Topics  . Alcohol use: Yes    Comment: occ  . Drug use: No    Home Medications Prior to Admission medications   Medication Sig Start Date End Date Taking? Authorizing Provider  azithromycin (ZITHROMAX Z-PAK) 250 MG tablet Take 500 mg on day 1, then 250 mg once a day for days 2, 3, 4, and 5. 01/20/20   Cardama, Amadeo Garnet, MD  clotrimazole (LOTRIMIN) 1 % cream Mix with the hydrocortisone cream before applying twice daily. 01/10/20   Lorelee New, PA-C  hydrocortisone 2.5 % lotion Apply topically 2 (two) times daily. Mix with the clotrimazole lotion before applying twice daily. 01/10/20   Lorelee New, PA-C  metFORMIN (GLUCOPHAGE) 500 MG tablet Take 1 tablet (500  mg total) by mouth 2 (two) times daily with a meal. 01/11/18   Terrilee Files, MD  Multiple Vitamin (MULTIVITAMIN WITH MINERALS) TABS tablet Take 1 tablet by mouth daily.    [provider]  Omega-3 Fatty Acids (FISH OIL PO) Take 50 mg by mouth daily.    [provider]  predniSONE (DELTASONE) 20 MG tablet Take 2 tablets (40 mg total) by mouth daily. 01/18/20   Gwyneth Sprout, MD  albuterol (PROVENTIL HFA;VENTOLIN HFA) 108 (90 Base) MCG/ACT inhaler Inhale 1-2 puffs into the lungs every 6 (six) hours as needed for wheezing or shortness of breath. Patient not taking: Reported on 12/23/2017 03/19/17 07/14/19  Glynn Octave, MD  dicyclomine (BENTYL) 20 MG tablet Take 1 tablet (20 mg total) by mouth 2 (two) times daily as needed (for abdominal cramping). Patient not taking: Reported on 12/23/2017 12/14/15 07/14/19  Antony Madura, PA-C    Allergies    Patient has no known allergies.  Review of Systems   Review of Systems  Constitutional: Negative for fever.  HENT: Negative for sore throat.   Respiratory: Negative for shortness of breath.   Cardiovascular: Positive for chest pain and near-syncope.  Gastrointestinal: Negative for abdominal pain, nausea and vomiting.  Genitourinary: Negative for flank pain.  Musculoskeletal: Positive for back pain.  Skin: Negative for pallor and wound.  Neurological: Positive for dizziness. Negative for weakness and numbness.  All other systems reviewed and are negative.   Physical Exam Updated Vital Signs BP (!) 133/96   Pulse (!) 58   Temp (!) 97.5 F (36.4 C) (Oral)   Resp 16   SpO2 100%   Physical Exam Vitals and nursing note reviewed.  Constitutional:      Appearance: She is well-developed.  HENT:     Head: Normocephalic and atraumatic.  Cardiovascular:     Rate and Rhythm: Normal rate.     Heart sounds: Normal heart sounds.     Comments: No bilateral calf tenderness, no pitting edema. Pulmonary:     Effort: Pulmonary effort is normal.     Breath sounds: No decreased breath sounds or wheezing.  Musculoskeletal:       Back:     Right lower leg: No edema.     Left lower leg: No edema.     Comments: Pain With palpation of the thoracic and lumbar spine.  Bruising noted to the area.  Skin:    General: Skin is warm and dry.  Neurological:     Mental Status: She is alert and oriented to person, place, and time.     ED Results / Procedures / Treatments   Labs (all labs ordered are listed, but only abnormal results are displayed) Labs Reviewed  BASIC METABOLIC PANEL - Abnormal; Notable for the following components:      Result Value   Glucose,  Bld 110 (*)    All other components within normal limits  CBC - Abnormal; Notable for the following components:   MCV 104.5 (*)    All other components within normal limits  URINALYSIS, ROUTINE W REFLEX MICROSCOPIC - Abnormal; Notable for the following components:   Leukocytes,Ua SMALL (*)    All other components within normal limits  HEPATIC FUNCTION PANEL  URINALYSIS, MICROSCOPIC (REFLEX)  TROPONIN I (HIGH SENSITIVITY)  TROPONIN I (HIGH SENSITIVITY)    EKG EKG Interpretation  Date/Time:  Sunday October 14 2020 09:08:31 EST Ventricular Rate:  73 PR Interval:  146 QRS Duration: 68 QT Interval:  374 QTC  Calculation: 412 R Axis:   51 Text Interpretation: Normal sinus rhythm Nonspecific ST and T wave abnormality Abnormal ECG Confirmed by Marianna Fussykstra, Richard ([id number]) on 10/14/2020 12:27:23 PM   Radiology DG Chest 2 View  Result Date: 10/14/2020 CLINICAL DATA:  Chest pain and syncope. EXAM: CHEST - 2 VIEW COMPARISON:  Chest x-ray dated Jan 18, 2020. FINDINGS: The heart size and mediastinal contours are within normal limits. Both lungs are clear. The visualized skeletal structures are unremarkable. IMPRESSION: No active cardiopulmonary disease. Electronically Signed   By: Obie DredgeWilliam T Derry M.D.   On: 10/14/2020 09:42   CT Head Wo Contrast  Result Date: 10/14/2020 CLINICAL DATA:  Syncope. EXAM: CT HEAD WITHOUT CONTRAST CT CERVICAL SPINE WITHOUT CONTRAST TECHNIQUE: Multidetector CT imaging of the head and cervical spine was performed following the standard protocol without intravenous contrast. Multiplanar CT image reconstructions of the cervical spine were also generated. COMPARISON:  CT head dated Jan 11, 2018. FINDINGS: CT HEAD FINDINGS Brain: No evidence of acute infarction, hemorrhage, hydrocephalus, extra-axial collection or mass lesion/mass effect. Vascular: Atherosclerotic vascular calcification of the carotid siphons. No hyperdense vessel. Skull: Normal. Negative for fracture or focal  lesion. Sinuses/Orbits: No acute finding. Other: None. CT CERVICAL SPINE FINDINGS Alignment: No traumatic malalignment. Straightening of the normal cervical lordosis. Skull base and vertebrae: No acute fracture. No primary bone lesion or focal pathologic process. Congenital C2-C3 fusion. Soft tissues and spinal canal: No prevertebral fluid or swelling. No visible canal hematoma. Disc levels: Partial interbody ankylosis at C5-C6. Mild disc height loss at C4-C5 and C6-C7. 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 Severe left and moderate right neuroforaminal stenosis at this level as well as C4-C5. Moderate left neuroforaminal stenosis at C3-C4 and C6-C7. Moderate bilateral uncovertebral hypertrophy at C4-C5 and C6-C7. Moderate facet arthropathy on the left at C3-C4 and bilaterally at T1-T2. Upper chest: Negative. Other: 6 mm hypodense nodule in the left thyroid lobe. Not clinically significant; no follow-up imaging recommended (ref: J Am Coll Radiol. 2015 Feb;12(2): 143-50). IMPRESSION: 1. No acute intracranial abnormality. 2. No acute cervical spine fracture or traumatic listhesis. 3. Multilevel cervical spondylosis as described above. 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 Severe left and moderate right neuroforaminal stenosis at this level as well as C4-C5. Electronically Signed   By: Obie DredgeWilliam T Derry M.D.   On: 10/14/2020 11:02   CT Cervical Spine Wo Contrast  Result Date: 10/14/2020 CLINICAL DATA:  Syncope. EXAM: CT HEAD WITHOUT CONTRAST CT CERVICAL SPINE WITHOUT CONTRAST TECHNIQUE: Multidetector CT imaging of the head and cervical spine was performed following the standard protocol without intravenous contrast. Multiplanar CT image reconstructions of the cervical spine were also generated. COMPARISON:  CT head dated Jan 11, 2018. FINDINGS: CT HEAD FINDINGS Brain: No evidence of acute infarction, hemorrhage, hydrocephalus, extra-axial collection or mass lesion/mass effect. Vascular: Atherosclerotic vascular calcification of the carotid siphons. No hyperdense vessel. Skull: Normal. Negative for fracture or focal lesion. Sinuses/Orbits: No acute finding. Other: None. CT CERVICAL SPINE FINDINGS Alignment: No traumatic malalignment. Straightening of the normal cervical lordosis. Skull base and vertebrae: No acute fracture. No primary bone lesion or focal pathologic process. Congenital C2-C3 fusion. Soft tissues and spinal canal: No prevertebral fluid or swelling. No visible canal hematoma. Disc levels: Partial interbody ankylosis at C5-C6. Mild disc height loss at C4-C5 and C6-C7. Bulky posterior longitudinal ligament  ossification at C5 and C6, and to a lesser extent at C7, with resultant moderate spinal canal stenosis at C5-C6. Severe left and moderate right neuroforaminal stenosis at this level as well as C4-C5. Moderate left neuroforaminal stenosis at C3-C4 and C6-C7. Moderate bilateral uncovertebral hypertrophy at C4-C5 and C6-C7. Moderate facet arthropathy on the left at C3-C4 and bilaterally at T1-T2. Upper chest: Negative. Other: 6 mm hypodense nodule in the left thyroid lobe. Not clinically significant; no follow-up imaging recommended (ref: J Am Coll Radiol. 2015 Feb;12(2): 143-50). IMPRESSION: 1. No acute intracranial abnormality. 2. No acute cervical spine fracture or traumatic listhesis. 3. Multilevel cervical spondylosis as described above. 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 Severe left and moderate right neuroforaminal stenosis at this level as well as C4-C5. Electronically Signed   By: Obie Dredge M.D.   On: 10/14/2020 11:02    Procedures Procedures   Medications Ordered in ED Medications  prochlorperazine (COMPAZINE) tablet  10 mg (10 mg Oral Given 10/14/20 1205)  ketorolac (TORADOL) 15 MG/ML injection 15 mg (15 mg Intramuscular Given 10/14/20 1205)    ED Course  I have reviewed the triage vital signs and the nursing notes.  Pertinent labs & imaging results that were available during my care of the patient were reviewed by me and considered in my medical decision making (see chart for details).  Clinical Course as of 10/14/20 1229  Wynelle Link Oct 14, 2020  1205 Glori Luis): SMALL [JS]    Clinical Course User Index [JS] Claude Manges, PA-C   MDM Rules/Calculators/A&P    Patient arrive s/p syncopal episode at home after feeling of heart palpitations and chest pain.  Reports loss of consciousness, reports roommate was able to pick her off from the ground.  She reports her chest pain along with dizziness has subsided however she continues to have pain along her head, lumbar spine.  She did take Tylenol for the symptoms without much improvement.  She arrived in the ED with stable vital signs.  Does endorse pain around the right forehead, no palpable deformities on my exam.  She is neurologically intact.  ASL  interpreter used for this encounter.  Interpretation of her labs reveal a CBC without any leukocytosis, globin is within normal limits.  BMP without any electrolyte abnormality, she does have a prior history of diabetes, currently on Metformin.  Creatinine level is unremarkable.  A hepatic panel was added without any elevation of her LFTs.  Troponins have been negative x2.  EKG without any changes consistent with infarct.  UA without any signs of infection just noted for some small leukocytes.  She was given Toradol to help with her headache along with Compazine.  Low suspicion for ACS as pain resolved without any medical intervention.EKG is NSR. No tachycardia or tachypnea. PERC negative.   CT Head/Cervical Spine:  1. No acute intracranial abnormality.  2. No acute cervical spine fracture or traumatic listhesis.   3. Multilevel cervical spondylosis as described above. Bulky  posterior longitudinal ligament ossification at C5 and C6, with  resultant moderate spinal canal stenosis at C5-C6. Severe left and  moderate right neuroforaminal stenosis at this level as well as  C4-C5.   Xray of her chest showed: No active cardiopulmonary disease. No signs of infectious process.   [place] syncope rule : low-risk group for serious outcome.   These results were discussed with patient at length via ASL interpreter, she reports improvement in symptoms  after medication along with food.  Vitals are within normal limits, return precautions discussed at length.  Patient discharged in stable condition.   Portions of this note were generated with Scientist, clinical (histocompatibility and immunogenetics). Dictation errors may occur despite best attempts at proofreading.  Final Clinical Impression(s) / ED Diagnoses Final diagnoses:  Atypical chest pain  Near syncope    Rx / DC Orders ED Discharge Orders    None       Claude Manges, PA-C 10/14/20 1229    Margarita Grizzle, MD 10/14/20 [phone]

## 2020-10-14 NOTE — ED Notes (Signed)
Patient transported to CT 

## 2020-10-14 NOTE — ED Notes (Signed)
Patient transported to X-ray 

## 2020-10-14 NOTE — ED Notes (Signed)
Patient is alert, oriented, ambulatory with steady and even gait. Showed her three home meds (lisinopril, metformin, and rosuvastatin). Hit her right temple and lower back when she passed out this morning. Will need ASL interpreter.

## 2020-10-14 NOTE — ED Triage Notes (Signed)
Pt reports pain to center of chest this morning with dizziness.  Had a syncopal episode this morning.  Also reports pain to lower back.  Reports weight loss.  Denies SOB.  [employer] Sign Language Interpreter used for triage.

## 2020-10-14 NOTE — Discharge Instructions (Addendum)
Your laboratory results were within normal limites.   Please follow up with your PCP as needed. You might benefit from cardiology visit as well.    If you experience chest pain, shortness of breath, or worsening symptoms please return to the ED.

## 2020-12-19 ENCOUNTER — Other Ambulatory Visit: Payer: Self-pay

## 2020-12-19 ENCOUNTER — Ambulatory Visit: Payer: Medicaid - Out of State

## 2021-06-30 LAB — COLOGUARD

## 2021-08-18 ENCOUNTER — Other Ambulatory Visit: Payer: Self-pay

## 2021-08-18 ENCOUNTER — Encounter: Payer: Self-pay | Admitting: Emergency Medicine

## 2021-08-18 ENCOUNTER — Emergency Department
Admission: EM | Admit: 2021-08-18 | Discharge: 2021-08-18 | Disposition: A | Discharge status: Home / Self Care | Payer: Medicaid - Out of State | Attending: Emergency Medicine | Admitting: Emergency Medicine

## 2021-08-18 DIAGNOSIS — E119 Type 2 diabetes mellitus without complications: Secondary | ICD-10-CM | POA: Diagnosis not present

## 2021-08-18 DIAGNOSIS — F17210 Nicotine dependence, cigarettes, uncomplicated: Secondary | ICD-10-CM | POA: Insufficient documentation

## 2021-08-18 DIAGNOSIS — I10 Essential (primary) hypertension: Secondary | ICD-10-CM | POA: Insufficient documentation

## 2021-08-18 DIAGNOSIS — Z7984 Long term (current) use of oral hypoglycemic drugs: Secondary | ICD-10-CM | POA: Diagnosis not present

## 2021-08-18 DIAGNOSIS — M79643 Pain in unspecified hand: Secondary | ICD-10-CM | POA: Diagnosis not present

## 2021-08-18 DIAGNOSIS — M79672 Pain in left foot: Secondary | ICD-10-CM | POA: Insufficient documentation

## 2021-08-18 DIAGNOSIS — M79671 Pain in right foot: Secondary | ICD-10-CM | POA: Diagnosis present

## 2021-08-18 LAB — CBC
HCT: 38.2 % (ref 36.0–46.0)
Hemoglobin: 12.8 g/dL (ref 12.0–15.0)
MCH: 33.8 pg (ref 26.0–34.0)
MCHC: 33.5 g/dL (ref 30.0–36.0)
MCV: 100.8 fL — ABNORMAL HIGH (ref 80.0–100.0)
Platelets: 235 10*3/uL (ref 150–400)
RBC: 3.79 MIL/uL — ABNORMAL LOW (ref 3.87–5.11)
RDW: 11.9 % (ref 11.5–15.5)
WBC: 4.5 10*3/uL (ref 4.0–10.5)
nRBC: 0 % (ref 0.0–0.2)

## 2021-08-18 LAB — BASIC METABOLIC PANEL
Anion gap: 4 — ABNORMAL LOW (ref 5–15)
BUN: 17 mg/dL (ref 8–23)
CO2: 29 mmol/L (ref 22–32)
Calcium: 9.2 mg/dL (ref 8.9–10.3)
Chloride: 106 mmol/L (ref 98–111)
Creatinine, Ser: 0.71 mg/dL (ref 0.44–1.00)
GFR, Estimated: >60 mL/min (ref >60)
Glucose, Bld: 95 mg/dL (ref 70–99)
Potassium: 4.1 mmol/L (ref 3.5–5.1)
Sodium: 139 mmol/L (ref 135–145)

## 2021-08-18 LAB — URINALYSIS, COMPLETE (UACMP) WITH MICROSCOPIC
Bacteria, UA: NONE SEEN
Bilirubin Urine: NEGATIVE
Glucose, UA: NEGATIVE mg/dL
Hgb urine dipstick: NEGATIVE
Ketones, ur: NEGATIVE mg/dL
Nitrite: NEGATIVE
Protein, ur: NEGATIVE mg/dL
Specific Gravity, Urine: 1.018 (ref 1.005–1.030)
pH: 6 (ref 5.0–8.0)

## 2021-08-18 NOTE — ED Provider Notes (Signed)
[facility] Emergency Department Provider Note  ____________________________________________   Event Date/Time   First MD Initiated Contact with Patient 08/18/21 1635     (approximate)  I have reviewed the triage vital signs and the nursing notes.   HISTORY  Chief Complaint Hand Pain (/) and Foot Pain   HPI Katelyn Winters is a 65 y.o. female with a past medical history of DM, HTN and deafness who presents for assessment of approximately 4 months of bilateral pain in her toes and arches of both feet.  She denies any preceding injuries or falls or pain above the ankles.  She states Tylenol ibuprofen has not helped.  She has not seen her PCP for this.  She has not had any other associated symptoms including rashes, swelling, fevers, chills, headache, earache, sore throat, chest pain, cough, shortness of breath, nausea, vomiting, diarrhea, burning with urination rash or other extremity pain.  No other acute concerns at this time.  She takes metformin for diabetes.         Past Medical History:  Diagnosis Date   Deaf    Diabetes mellitus without complication (HCC)    Hypertension     There are no problems to display for this patient.   Past Surgical History:  Procedure Laterality Date   BLADDER SURGERY      Prior to Admission medications   Medication Sig Start Date End Date Taking? Authorizing Provider  azithromycin (ZITHROMAX Z-PAK) 250 MG tablet Take 500 mg on day 1, then 250 mg once a day for days 2, 3, 4, and 5. 01/20/20   Cardama, Amadeo Garnet, MD  clotrimazole (LOTRIMIN) 1 % cream Mix with the hydrocortisone cream before applying twice daily. 01/10/20   Lorelee New, PA-C  hydrocortisone 2.5 % lotion Apply topically 2 (two) times daily. Mix with the clotrimazole lotion before applying twice daily. 01/10/20   Lorelee New, PA-C  metFORMIN (GLUCOPHAGE) 500 MG tablet Take 1 tablet (500 mg total) by mouth 2 (two) times daily with a meal.  01/11/18   Terrilee Files, MD  Multiple Vitamin (MULTIVITAMIN WITH MINERALS) TABS tablet Take 1 tablet by mouth daily.    [provider]  Omega-3 Fatty Acids (FISH OIL PO) Take 50 mg by mouth daily.    [provider]  predniSONE (DELTASONE) 20 MG tablet Take 2 tablets (40 mg total) by mouth daily. 01/18/20   Gwyneth Sprout, MD  albuterol (PROVENTIL HFA;VENTOLIN HFA) 108 (90 Base) MCG/ACT inhaler Inhale 1-2 puffs into the lungs every 6 (six) hours as needed for wheezing or shortness of breath. Patient not taking: Reported on 12/23/2017 03/19/17 07/14/19  Glynn Octave, MD  dicyclomine (BENTYL) 20 MG tablet Take 1 tablet (20 mg total) by mouth 2 (two) times daily as needed (for abdominal cramping). Patient not taking: Reported on 12/23/2017 12/14/15 07/14/19  Antony Madura, PA-C    Allergies Patient has no known allergies.  History reviewed. No pertinent family history.  Social History Social History   Tobacco Use   Smoking status: Some Days    Types: Cigarettes   Smokeless tobacco: Never  Vaping Use   Vaping Use: Never used  Substance Use Topics   Alcohol use: Yes    Comment: occ   Drug use: No    Review of Systems  Review of Systems  Constitutional:  Negative for chills and fever.  HENT:  Negative for sore throat.   Eyes:  Negative for pain.  Respiratory:  Negative for cough  and stridor.   Cardiovascular:  Negative for chest pain.  Gastrointestinal:  Negative for vomiting.  Genitourinary:  Negative for dysuria.  Musculoskeletal:  Positive for myalgias (b/l feet).  Skin:  Negative for rash.  Neurological:  Negative for seizures, loss of consciousness and headaches.  Psychiatric/Behavioral:  Negative for suicidal ideas.   All other systems reviewed and are negative.    ____________________________________________   PHYSICAL EXAM:  VITAL SIGNS: ED Triage Vitals  Enc Vitals Group     BP 08/18/21 1546 125/74     Pulse Rate 08/18/21 1546 64      Resp 08/18/21 1546 20     Temp 08/18/21 1546 98 F (36.7 C)     Temp Source 08/18/21 1546 Oral     SpO2 08/18/21 1546 97 %     Weight 08/18/21 1547 180 lb (81.6 kg)     Height 08/18/21 1547 5\' 6"  (1.676 m)     Head Circumference --      Peak Flow --      Pain Score 08/18/21 1546 10     Pain Loc --      Pain Edu? --      Excl. in GC? --    Vitals:   08/18/21 1546  BP: 125/74  Pulse: 64  Resp: 20  Temp: 98 F (36.7 C)  SpO2: 97%   Physical Exam Vitals and nursing note reviewed.  Constitutional:      General: She is not in acute distress.    Appearance: She is well-developed.  HENT:     Head: Normocephalic and atraumatic.     Right Ear: External ear normal.     Left Ear: External ear normal.     Nose: Nose normal.  Eyes:     Conjunctiva/sclera: Conjunctivae normal.  Cardiovascular:     Rate and Rhythm: Normal rate and regular rhythm.     Heart sounds: No murmur heard. Pulmonary:     Effort: Pulmonary effort is normal. No respiratory distress.     Breath sounds: Normal breath sounds.  Abdominal:     Palpations: Abdomen is soft.     Tenderness: There is no abdominal tenderness.  Musculoskeletal:        General: No swelling.     Cervical back: Neck supple.     Right lower leg: Edema (1+) present.     Left lower leg: Edema (1+) present.  Skin:    General: Skin is warm and dry.     Capillary Refill: Capillary refill takes less than 2 seconds.  Neurological:     Mental Status: She is alert and oriented to person, place, and time.  Psychiatric:        Mood and Affect: Mood normal.    Patient has fairly flat feet bilaterally and some some lateral bunions bilaterally.  There is also some tenderness on the along the arches of the feet but not specifically at the insertion of the plantar fascia on the Achilles.  There is no overlying skin changes.  2+ DP pulses.  Sensations intact light touch throughout.  Patient is symmetric strength in lower  extremities. ____________________________________________   LABS (all labs ordered are listed, but only abnormal results are displayed)  Labs Reviewed  CBC - Abnormal; Notable for the following components:      Result Value   RBC 3.79 (*)    MCV 100.8 (*)    All other components within normal limits  BASIC METABOLIC PANEL - Abnormal; Notable for the following  components:   Anion gap 4 (*)    All other components within normal limits  URINALYSIS, COMPLETE (UACMP) WITH MICROSCOPIC - Abnormal; Notable for the following components:   Color, Urine YELLOW (*)    APPearance CLEAR (*)    Leukocytes,Ua TRACE (*)    All other components within normal limits   ____________________________________________  EKG  ____________________________________________  RADIOLOGY  ED MD interpretation:    Official radiology report(s): No results found.  ____________________________________________   PROCEDURES  Procedure(s) performed (including Critical Care):  Procedures   ____________________________________________   INITIAL IMPRESSION / ASSESSMENT AND PLAN / ED COURSE      Patient presents with above-stated history exam for evaluation of chronic bilateral pain in both her toes and arches of both feet.  On arrival she is afebrile and hemodynamically stable.  On exam she is neurovascular intact bilaterally and has very supple 1+ pitting edema otherwise some mild tenderness along the arches of both feet and some bunions are noted.  Differential includes pain related to plantar fasciitis, neuropathy related to her diabetes, overuse injury, spending a fair amount of time on her feet walking for an hour every day, and possible muscle pain from some weak arches.  There is no historical or exam features to suggest acute infectious process, DVT, septic ankle or other joint in the foot or other immediate life-threatening process.  Advised patient she can follow-up with PCP and podiatry.   Discharged in stable condition.  Strict return precautions advised and discussed.          ____________________________________________   FINAL CLINICAL IMPRESSION(S) / ED DIAGNOSES  Final diagnoses:  Pain in both feet    Medications - No data to display   ED Discharge Orders     None        Note:  This document was prepared using Dragon voice recognition software and may include unintentional dictation errors.    Gilles Chiquito, MD 08/18/21 [phone]

## 2021-08-18 NOTE — ED Triage Notes (Signed)
Pt via POV from home. Pt c/o bilateral hand and foot pain for the past 2 months that are sometimes numb but is states its mostly painful. Pt states she is diabetic. Denies hx of neuropathy. Pt states she hasn't been taking her sugar at home and is on Metformin. Pt is A&Ox4 and NAD.   Sign language interpreter needed.

## 2021-08-21 ENCOUNTER — Emergency Department: Payer: Medicaid - Out of State

## 2021-08-21 ENCOUNTER — Other Ambulatory Visit: Payer: Self-pay

## 2021-08-21 DIAGNOSIS — R519 Headache, unspecified: Secondary | ICD-10-CM | POA: Insufficient documentation

## 2021-08-21 DIAGNOSIS — Z5321 Procedure and treatment not carried out due to patient leaving prior to being seen by health care provider: Secondary | ICD-10-CM | POA: Diagnosis not present

## 2021-08-21 DIAGNOSIS — Z20822 Contact with and (suspected) exposure to covid-19: Secondary | ICD-10-CM | POA: Diagnosis not present

## 2021-08-21 DIAGNOSIS — R079 Chest pain, unspecified: Secondary | ICD-10-CM | POA: Insufficient documentation

## 2021-08-21 DIAGNOSIS — R059 Cough, unspecified: Secondary | ICD-10-CM | POA: Diagnosis not present

## 2021-08-21 LAB — CBC
HCT: 40.2 % (ref 36.0–46.0)
Hemoglobin: 13.4 g/dL (ref 12.0–15.0)
MCH: 33.7 pg (ref 26.0–34.0)
MCHC: 33.3 g/dL (ref 30.0–36.0)
MCV: 101 fL — ABNORMAL HIGH (ref 80.0–100.0)
Platelets: 225 10*3/uL (ref 150–400)
RBC: 3.98 MIL/uL (ref 3.87–5.11)
RDW: 11.9 % (ref 11.5–15.5)
WBC: 5 10*3/uL (ref 4.0–10.5)
nRBC: 0 % (ref 0.0–0.2)

## 2021-08-21 LAB — TROPONIN I (HIGH SENSITIVITY)
Troponin I (High Sensitivity): 3 ng/L (ref <18)
Troponin I (High Sensitivity): 3 ng/L (ref <18)

## 2021-08-21 LAB — BASIC METABOLIC PANEL
Anion gap: 6 (ref 5–15)
BUN: 19 mg/dL (ref 8–23)
CO2: 29 mmol/L (ref 22–32)
Calcium: 9.6 mg/dL (ref 8.9–10.3)
Chloride: 104 mmol/L (ref 98–111)
Creatinine, Ser: 0.88 mg/dL (ref 0.44–1.00)
GFR, Estimated: >60 mL/min (ref >60)
Glucose, Bld: 168 mg/dL — ABNORMAL HIGH (ref 70–99)
Potassium: 4 mmol/L (ref 3.5–5.1)
Sodium: 139 mmol/L (ref 135–145)

## 2021-08-21 LAB — RESP PANEL BY RT-PCR (FLU A&B, COVID) ARPGX2
Influenza A by PCR: NEGATIVE
Influenza B by PCR: NEGATIVE
SARS Coronavirus 2 by RT PCR: NEGATIVE

## 2021-08-21 NOTE — ED Triage Notes (Signed)
Triage performed with assistance of ASL interpreter. Pt via POV c/o productive cough and central chest pain since Nov 30. Pain is currently rated 9/10 and is worse with deep inspiration. Pt reports yellow sputum and headache, denies additional symptoms at this time.

## 2021-08-22 ENCOUNTER — Other Ambulatory Visit: Payer: Self-pay

## 2021-08-22 ENCOUNTER — Emergency Department (HOSPITAL_COMMUNITY): Payer: Medicaid - Out of State

## 2021-08-22 ENCOUNTER — Emergency Department
Admission: EM | Admit: 2021-08-22 | Discharge: 2021-08-22 | Disposition: A | Discharge status: Home / Self Care | Payer: Medicaid - Out of State | Attending: Emergency Medicine | Admitting: Emergency Medicine

## 2021-08-22 ENCOUNTER — Encounter (HOSPITAL_COMMUNITY): Payer: Self-pay | Admitting: *Deleted

## 2021-08-22 ENCOUNTER — Emergency Department (HOSPITAL_COMMUNITY)
Admission: EM | Admit: 2021-08-22 | Discharge: 2021-08-22 | Disposition: A | Discharge status: Home / Self Care | Payer: Medicaid - Out of State | Attending: Emergency Medicine | Admitting: Emergency Medicine

## 2021-08-22 DIAGNOSIS — I10 Essential (primary) hypertension: Secondary | ICD-10-CM | POA: Insufficient documentation

## 2021-08-22 DIAGNOSIS — Z7984 Long term (current) use of oral hypoglycemic drugs: Secondary | ICD-10-CM | POA: Insufficient documentation

## 2021-08-22 DIAGNOSIS — F17210 Nicotine dependence, cigarettes, uncomplicated: Secondary | ICD-10-CM | POA: Insufficient documentation

## 2021-08-22 DIAGNOSIS — Z20822 Contact with and (suspected) exposure to covid-19: Secondary | ICD-10-CM | POA: Diagnosis not present

## 2021-08-22 DIAGNOSIS — R059 Cough, unspecified: Secondary | ICD-10-CM | POA: Diagnosis present

## 2021-08-22 DIAGNOSIS — J101 Influenza due to other identified influenza virus with other respiratory manifestations: Secondary | ICD-10-CM | POA: Insufficient documentation

## 2021-08-22 DIAGNOSIS — R062 Wheezing: Secondary | ICD-10-CM

## 2021-08-22 DIAGNOSIS — Z79899 Other long term (current) drug therapy: Secondary | ICD-10-CM | POA: Diagnosis not present

## 2021-08-22 LAB — RESP PANEL BY RT-PCR (FLU A&B, COVID) ARPGX2
Influenza A by PCR: POSITIVE — AB
Influenza B by PCR: NEGATIVE
SARS Coronavirus 2 by RT PCR: NEGATIVE

## 2021-08-22 LAB — CBG MONITORING, ED: Glucose-Capillary: 109 mg/dL — ABNORMAL HIGH (ref 70–99)

## 2021-08-22 MED ORDER — ALBUTEROL SULFATE HFA 108 (90 BASE) MCG/ACT IN AERS
2.0000 | INHALATION_SPRAY | Freq: Once | RESPIRATORY_TRACT | Status: AC
  Administered 2021-08-22: 12:00:00 2 via RESPIRATORY_TRACT
  Filled 2021-08-22: qty 6.7

## 2021-08-22 MED ORDER — PREDNISONE 20 MG PO TABS
60.0000 mg | ORAL_TABLET | Freq: Once | ORAL | Status: AC
  Administered 2021-08-22: 11:00:00 60 mg via ORAL
  Filled 2021-08-22: qty 3

## 2021-08-22 MED ORDER — IPRATROPIUM-ALBUTEROL 0.5-2.5 (3) MG/3ML IN SOLN
3.0000 mL | Freq: Once | RESPIRATORY_TRACT | Status: AC
  Administered 2021-08-22: 11:00:00 3 mL via RESPIRATORY_TRACT
  Filled 2021-08-22: qty 3

## 2021-08-22 MED ORDER — BENZONATATE 100 MG PO CAPS: 200.0000 mg | ORAL_CAPSULE | Freq: Three times a day (TID) | ORAL | 0 refills | Status: AC | PRN

## 2021-08-22 MED ORDER — AEROCHAMBER PLUS FLO-VU LARGE MISC
1.0000 | Freq: Once | Status: AC
  Administered 2021-08-22: 12:00:00 1

## 2021-08-22 MED ORDER — DOXYCYCLINE HYCLATE 100 MG PO TABS
100.0000 mg | ORAL_TABLET | Freq: Once | ORAL | Status: AC
  Administered 2021-08-22: 11:00:00 100 mg via ORAL
  Filled 2021-08-22: qty 1

## 2021-08-22 MED ORDER — ACETAMINOPHEN 500 MG PO TABS
1000.0000 mg | ORAL_TABLET | Freq: Once | ORAL | Status: AC
  Administered 2021-08-22: 11:00:00 1000 mg via ORAL
  Filled 2021-08-22: qty 2

## 2021-08-22 MED ORDER — PREDNISONE 20 MG PO TABS: ORAL_TABLET | ORAL | 0 refills | Status: DC

## 2021-08-22 NOTE — ED Notes (Signed)
Using interpreter, pt reports understanding of discharge paperwork, prescriptions and follow-up care.

## 2021-08-22 NOTE — ED Notes (Signed)
Patient called to treatment room, no answer x1. Other patients in the waiting room stated they had seen this patient go outside.

## 2021-08-22 NOTE — ED Triage Notes (Signed)
Patient presents to ed c/o cough onset Nov. 30  states her family wanted her to come to ED and have it checked out. Denies chest pain , patient is deaf stratus machine used.

## 2021-08-22 NOTE — Discharge Instructions (Addendum)
1.  You did test positive for influenza today.  This will cause a lot of coughing and headache. 2.  Take prednisone as prescribed to help with the inflammation that causes wheezing.  Use the albuterol inhaler with a spacer every 4 hours for wheezing and cough.  Your symptoms are significantly improving, you can decrease your use of the inhaler.  You have been given a prescription for [employer].  These are to help with coughing episodes.  Swallow them whole.  Do not suck on them or chew them. 3.  Take extra strength Tylenol every 6 hours for headache and body aches. 4.  Return to the emergency department if you are getting increasing shortness of breath, confusion, cannot stay hydrated or other concerning symptoms.

## 2021-08-22 NOTE — ED Provider Notes (Signed)
[facility] EMERGENCY DEPARTMENT Provider Note   CSN: [id number] Arrival date & time: 08/22/21  0345     History Chief Complaint  Patient presents with   Cough    Katelyn Winters is a 65 y.o. female.  HPI Patient reports she had a productive cough for almost 3 weeks.  She reports is productive of yellow sputum.  She has been wheezing.  She feels short of breath with activity.  Patient reports she is got diffuse achy chest pain.  No abdominal pain no nausea no vomiting.  She does feel that her ankles are slightly swollen.  No calf pain.  Patient has not had any fevers that she is aware of.  She does feel like she has a generalized headache.  No sore throat or difficulty swallowing.  Patient has not had any treatment up to this point.  She reports she had a similar episode about a year ago and it took about a month to resolve.  She reports she was given some inhalers and medications at that time but it just seemed to dry gone and took about a month actually just go away.  Patient reports that she does smoke a little bit.  She smokes a few cigarettes here and there.  Patient has had no treatment for her symptoms thus far.    Past Medical History:  Diagnosis Date   Deaf    Diabetes mellitus without complication (HCC)    Hypertension     There are no problems to display for this patient.   Past Surgical History:  Procedure Laterality Date   BLADDER SURGERY       OB History   No obstetric history on file.     No family history on file.  Social History   Tobacco Use   Smoking status: Some Days    Types: Cigarettes   Smokeless tobacco: Never  Vaping Use   Vaping Use: Never used  Substance Use Topics   Alcohol use: Yes    Comment: occ   Drug use: No    Home Medications Prior to Admission medications   Medication Sig Start Date End Date Taking? Authorizing Provider  benzonatate (TESSALON) 100 MG capsule Take 2 capsules (200 mg total) by mouth 3  (three) times daily as needed for cough. 08/22/21  Yes Arby Barrette, MD  gabapentin (NEURONTIN) 300 MG capsule Take 300 mg by mouth 3 (three) times daily. 08/12/21  Yes [provider]  lisinopril (ZESTRIL) 5 MG tablet Take 5 mg by mouth daily. 06/28/21  Yes [provider]  metFORMIN (GLUCOPHAGE) 500 MG tablet Take 1 tablet (500 mg total) by mouth 2 (two) times daily with a meal. 01/11/18  Yes Terrilee Files, MD  predniSONE (DELTASONE) 20 MG tablet 2 tabs po daily x 4 days 08/22/21  Yes Macintyre Alexa, Lebron Conners, MD  rosuvastatin (CRESTOR) 20 MG tablet Take 20 mg by mouth daily. 07/18/21  Yes [provider]  albuterol (PROVENTIL HFA;VENTOLIN HFA) 108 (90 Base) MCG/ACT inhaler Inhale 1-2 puffs into the lungs every 6 (six) hours as needed for wheezing or shortness of breath. Patient not taking: Reported on 12/23/2017 03/19/17 07/14/19  Glynn Octave, MD  dicyclomine (BENTYL) 20 MG tablet Take 1 tablet (20 mg total) by mouth 2 (two) times daily as needed (for abdominal cramping). Patient not taking: Reported on 12/23/2017 12/14/15 07/14/19  Antony Madura, PA-C    Allergies    Patient has no known allergies.  Review of Systems   Review  of Systems 10 systems reviewed negative except as per HPI Physical Exam Updated Vital Signs BP 117/74    Pulse 89    Temp 98.1 F (36.7 C) (Oral)    Resp 16    Ht 5\' 6"  (1.676 m)    Wt 89.8 kg    SpO2 98%    BMI 31.96 kg/m   Physical Exam Constitutional:      Comments: Alert nontoxic no respiratory distress at rest.  HENT:     Head: Normocephalic and atraumatic.     Mouth/Throat:     Mouth: Mucous membranes are moist.     Pharynx: Oropharynx is clear.  Eyes:     Extraocular Movements: Extraocular movements intact.  Cardiovascular:     Rate and Rhythm: Normal rate and regular rhythm.  Pulmonary:     Comments: Frequent cough paroxysms.  Diffuse wheezing throughout lung fields.  Patient does not appear to be respiratory distress at  rest.  Chest diffusely tender to palpation.  Right slightly greater than left. Abdominal:     General: There is no distension.     Palpations: Abdomen is soft.     Tenderness: There is no abdominal tenderness. There is no guarding.  Musculoskeletal:     Comments: Trace edema bilateral ankles.  Calf soft and nontender.  Skin:    General: Skin is warm and dry.  Neurological:     General: No focal deficit present.     Mental Status: She is oriented to person, place, and time.     Motor: No weakness.     Coordination: Coordination normal.  Psychiatric:        Mood and Affect: Mood normal.    ED Results / Procedures / Treatments   Labs (all labs ordered are listed, but only abnormal results are displayed) Labs Reviewed  RESP PANEL BY RT-PCR (FLU A&B, COVID) ARPGX2 - Abnormal; Notable for the following components:      Result Value   Influenza A by PCR POSITIVE (*)    All other components within normal limits  CBG MONITORING, ED - Abnormal; Notable for the following components:   Glucose-Capillary 109 (*)    All other components within normal limits    EKG None  Radiology DG Chest 2 View  Result Date: 08/22/2021 CLINICAL DATA:  Cough. EXAM: CHEST - 2 VIEW COMPARISON:  08/21/2021. FINDINGS: The heart size and mediastinal contours are within normal limits. Atherosclerotic calcification of the aorta. Mild subsegmental atelectasis noted at the left lung base. No consolidation, effusion, or pneumothorax. Degenerative changes are noted in the thoracic spine. No acute osseous abnormality. Surgical clips are identified in the right upper quadrant. IMPRESSION: No active cardiopulmonary disease. Electronically Signed   By: 08/23/2021 M.D.   On: 08/22/2021 04:41   DG Chest 2 View  Result Date: 08/21/2021 CLINICAL DATA:  Chest pain EXAM: CHEST - 2 VIEW COMPARISON:  10/14/2020 FINDINGS: Lungs are well expanded, symmetric, and clear. No pneumothorax or pleural effusion. Cardiac size within  normal limits. Pulmonary vascularity is normal. Osseous structures are age-appropriate. No acute bone abnormality. IMPRESSION: No active cardiopulmonary disease. Electronically Signed   By: 10/16/2020 M.D.   On: 08/21/2021 19:30    Procedures Procedures   Medications Ordered in ED Medications  albuterol (VENTOLIN HFA) 108 (90 Base) MCG/ACT inhaler 2 puff (has no administration in time range)  AeroChamber Plus Flo-Vu Large MISC 1 each (has no administration in time range)  ipratropium-albuterol (DUONEB) 0.5-2.5 (3) MG/3ML nebulizer solution 3  mL (3 mLs Nebulization Given 08/22/21 1031)  predniSONE (DELTASONE) tablet 60 mg (60 mg Oral Given 08/22/21 1031)  doxycycline (VIBRA-TABS) tablet 100 mg (100 mg Oral Given 08/22/21 1032)  acetaminophen (TYLENOL) tablet 1,000 mg (1,000 mg Oral Given 08/22/21 1032)    ED Course  I have reviewed the triage vital signs and the nursing notes.  Pertinent labs & imaging results that were available during my care of the patient were reviewed by me and considered in my medical decision making (see chart for details).    MDM Rules/Calculators/A&P                         Patient has had almost 3 weeks of cough with wheeze.  She does not endorse fever or myalgia.  She is not having vomiting or abdominal pain.  Patient has extensive wheezing on exam.  Chest x-ray does not show any focal pneumonia.  At this time, I most suspect acute bronchitis.  Patient does smoke infrequently.  Will obtain COVID and flu testing.  We will give DuoNeb therapy.  With duration of symptoms productive cough with a smoker will add doxycycline and prednisone.  I have reviewed plan with the patient for outpatient follow-up to monitor response to treatment.  Influenza has returned positive.  Since patient describes onset of symptoms for over 3 weeks, not a candidate for Tamiflu.  Patient is clinically well in appearance.  She is nontoxic.  She does not appear dehydrated.  Mental status  is clear.  She does have extensive wheezing and coughing.  We will proceed with prednisone as planned and dispense albuterol inhaler and Tessalon Perles.  Careful return precautions included in discharge instructions.  Final Clinical Impression(s) / ED Diagnoses Final diagnoses:  Influenza A  Wheeze    Rx / DC Orders ED Discharge Orders          Ordered    predniSONE (DELTASONE) 20 MG tablet        08/22/21 1202    benzonatate (TESSALON) 100 MG capsule  3 times daily PRN        08/22/21 1202             Arby Barrette, MD 08/22/21 1204

## 2021-09-11 LAB — COLOGUARD

## 2021-12-13 NOTE — Progress Notes (Shared)
?[facility] - Clinic Note ? ?12/18/2021 ? ?  ? ?CHIEF COMPLAINT ?Patient presents for No chief complaint on file. ? ? ?HISTORY OF PRESENT ILLNESS: ?Katelyn Winters is a 66 y.o. female who presents to the clinic today for:  ? ? ? ?Referring physician: ?Morrell Riddle, PA-C ?[address] ?[address] ?[place],  [place] [address] ? ?HISTORICAL INFORMATION:  ? ?Selected notes from the MEDICAL RECORD NUMBER ?Referred by Dr. Marland KitchenLEE:  ?Ocular Hx- ?PMH- ?  ? ?CURRENT MEDICATIONS: ?No current outpatient medications on file. (Ophthalmic Drugs)  ? ?No current facility-administered medications for this visit. (Ophthalmic Drugs)  ? ?Current Outpatient Medications (Other)  ?Medication Sig  ? benzonatate (TESSALON) 100 MG capsule Take 2 capsules (200 mg total) by mouth 3 (three) times daily as needed for cough.  ? gabapentin (NEURONTIN) 300 MG capsule Take 300 mg by mouth 3 (three) times daily.  ? lisinopril (ZESTRIL) 5 MG tablet Take 5 mg by mouth daily.  ? metFORMIN (GLUCOPHAGE) 500 MG tablet Take 1 tablet (500 mg total) by mouth 2 (two) times daily with a meal.  ? predniSONE (DELTASONE) 20 MG tablet 2 tabs po daily x 4 days  ? rosuvastatin (CRESTOR) 20 MG tablet Take 20 mg by mouth daily.  ? ?No current facility-administered medications for this visit. (Other)  ? ? ? ? ?REVIEW OF SYSTEMS: ? ? ? ?ALLERGIES ?No Known Allergies ? ?PAST MEDICAL HISTORY ?Past Medical History:  ?Diagnosis Date  ? Deaf   ? Diabetes mellitus without complication (HCC)   ? Hypertension   ? ?Past Surgical History:  ?Procedure Laterality Date  ? BLADDER SURGERY    ? ? ?FAMILY HISTORY ?No family history on file. ? ?SOCIAL HISTORY ?Social History  ? ?Tobacco Use  ? Smoking status: Some Days  ?  Types: Cigarettes  ? Smokeless tobacco: Never  ?Vaping Use  ? Vaping Use: Never used  ?Substance Use Topics  ? Alcohol use: Yes  ?  Comment: occ  ? Drug use: No  ? ?  ? ?  ? ?OPHTHALMIC EXAM: ? ?Not recorded ?  ? ? ?IMAGING AND PROCEDURES  ?Imaging and  Procedures for 12/18/2021 ? ? ? ?  ?  ? ?  ?ASSESSMENT/PLAN: ? ?No diagnosis found. ? ?1. ? ?2. ? ?3. ? ?Ophthalmic Meds Ordered this visit:  ?No orders of the defined types were placed in this encounter. ? ? ?  ? ?No follow-ups on file. ? ?There are no Patient Instructions on file for this visit. ? ? ?Explained the diagnoses, plan, and follow up with the patient and they expressed understanding.  Patient expressed understanding of the importance of proper follow up care.  ? ?Karie Chimera, M.D., Ph.D. ?Diseases & Surgery of the Retina and Vitreous ?[facility] ?@TODAY @ ? ? ? ? ?Abbreviations: ?M myopia (nearsighted); A astigmatism; H hyperopia (farsighted); P presbyopia; Mrx spectacle prescription;  CTL contact lenses; OD right eye; OS left eye; OU both eyes  XT exotropia; ET esotropia; PEK punctate epithelial keratitis; PEE punctate epithelial erosions; DES dry eye syndrome; MGD meibomian gland dysfunction; ATs artificial tears; PFAT's preservative free artificial tears; NSC nuclear sclerotic cataract; PSC posterior subcapsular cataract; ERM epi-retinal membrane; PVD posterior vitreous detachment; RD retinal detachment; DM diabetes mellitus; DR diabetic retinopathy; NPDR non-proliferative diabetic retinopathy; PDR proliferative diabetic retinopathy; CSME clinically significant macular edema; DME diabetic macular edema; dbh dot blot hemorrhages; CWS cotton wool spot; POAG primary open angle glaucoma; C/D cup-to-disc  ratio; HVF humphrey visual field; GVF goldmann visual field; OCT optical coherence tomography; IOP intraocular pressure; BRVO Branch retinal vein occlusion; CRVO central retinal vein occlusion; CRAO central retinal artery occlusion; BRAO branch retinal artery occlusion; RT retinal tear; SB scleral buckle; PPV pars plana vitrectomy; VH Vitreous hemorrhage; PRP panretinal laser photocoagulation; IVK intravitreal kenalog; VMT vitreomacular traction; MH Macular hole;  NVD  neovascularization of the disc; NVE neovascularization elsewhere; AREDS age related eye disease study; ARMD age related macular degeneration; POAG primary open angle glaucoma; EBMD epithelial/anterior basement membrane dystrophy; ACIOL anterior chamber intraocular lens; IOL intraocular lens; PCIOL posterior chamber intraocular lens; Phaco/IOL phacoemulsification with intraocular lens placement; PRK photorefractive keratectomy; LASIK laser assisted in situ keratomileusis; HTN hypertension; DM diabetes mellitus; COPD chronic obstructive pulmonary disease ? ?

## 2021-12-18 ENCOUNTER — Encounter (INDEPENDENT_AMBULATORY_CARE_PROVIDER_SITE_OTHER): Payer: Self-pay

## 2021-12-18 ENCOUNTER — Encounter (INDEPENDENT_AMBULATORY_CARE_PROVIDER_SITE_OTHER): Payer: Medicare Other | Admitting: Ophthalmology

## 2021-12-18 DIAGNOSIS — H3581 Retinal edema: Secondary | ICD-10-CM

## 2022-03-27 ENCOUNTER — Telehealth: Payer: Self-pay | Admitting: Pulmonary Disease

## 2022-03-27 ENCOUNTER — Encounter: Payer: Self-pay | Admitting: Pulmonary Disease

## 2022-03-27 ENCOUNTER — Ambulatory Visit (INDEPENDENT_AMBULATORY_CARE_PROVIDER_SITE_OTHER): Payer: Self-pay | Admitting: Pulmonary Disease

## 2022-03-27 VITALS — BP 102/60 | HR 76 | Ht 66.0 in | Wt 200.2 lb

## 2022-03-27 DIAGNOSIS — J4540 Moderate persistent asthma, uncomplicated: Secondary | ICD-10-CM

## 2022-03-27 DIAGNOSIS — R052 Subacute cough: Secondary | ICD-10-CM

## 2022-03-27 MED ORDER — ALBUTEROL SULFATE HFA 108 (90 BASE) MCG/ACT IN AERS: 2.0000 | INHALATION_SPRAY | Freq: Four times a day (QID) | RESPIRATORY_TRACT | 11 refills | Status: DC | PRN

## 2022-03-27 MED ORDER — FLUTICASONE FUROATE-VILANTEROL 200-25 MCG/ACT IN AEPB: 1.0000 | INHALATION_SPRAY | Freq: Every day | RESPIRATORY_TRACT | 11 refills | Status: DC

## 2022-03-27 NOTE — Telephone Encounter (Signed)
[place] already aware. Nothing further needed

## 2022-03-27 NOTE — Patient Instructions (Signed)
It is nice to meet you!  I think that you likely have developed asthma.  It is possible that your history of cigarette smoking is called up to you as well.  But these are treated very similarly.  I am glad the new medication with the blue top, the brand name is Virgel Bouquet, has helped.  I sent in a prescription for this.  Okay to substitute the generic like you have at home.  Use 1 puff once a day.  Rinse your mouth out after every use.  In addition, I sent a prescription for albuterol.  This is a different kind of inhaler.  You only need to use this as needed for chest tightness or shortness of breath.  I hope the Virgel Bouquet keeps everything at bay and you will not need this very often.  However I think it is good for you to have in case you have breakthrough symptoms.  Your chest x-ray was clear a couple weeks ago.  Your exam is clear.  No need for chest x-ray today.  Return to clinic in 3 months or sooner as needed with Dr. Judeth Horn

## 2022-03-27 NOTE — Progress Notes (Signed)
@Patient  ID: , female    DOB: 28-Sep-1955, 66 y.o.   MRN: [id number]  Chief Complaint  Patient presents with   Consult    Consult: COPD    Referring provider: [id number]  HPI:   66 y.o. woman whom we are seeing in consultation for evaluation of recurrent bronchitis, cough, dyspnea.  Note from referring provider reviewed.  Most recent PCP note reviewed.  History obtained via the assistance of sign language interpreter.  Patient presents today for evaluation of recurrent cough and shortness of breath.  By my count, 7 ED visits for similar complaints in this calendar year of 2023.  About once a month.  Often improved with steroids sometimes antibiotics.  Per reports, chest images are clear.  She is a former smoker about half pack a day but quit earlier this year with ongoing issues with breathing.  She reports similar issues in the past but much more frequent over the last several months.  Today, she denies much dyspnea.  Not very short of breath.  Her cough is largely improved.  She attributed to starting Breo about 2 to 3 weeks ago.  She does not have rescue inhaler.  No clear triggers to bouts of what sound like bronchitis with prolonged dyspnea and cough.  No other alleviating or exacerbating factors other than identified above.  Most recent chest x-ray [place] view 08/2021 demonstrates mild hyperinflation, otherwise clear lungs.  More recent chest x-ray 03/2022 report in care everywhere reveals clear lungs bilaterally.   PMH: Tobacco abuse in remission, hypertension, hyperlipidemia Surgical history: Bladder surgery Family history:History reviewed. No pertinent family history. Social history: Former smoker, quit around March 2023, lives in [facility]  Questionaires / Pulmonary Flowsheets:   ACT:      No data to display          MMRC:     No data to display          Epworth:      No data to display          Tests:   FENO:   No results found for: "NITRICOXIDE"  PFT:     No data to display          WALK:      No data to display          Imaging: Personally reviewed and as per EMR discussion this note No results found.  Lab Results: Personally reviewed CBC    Component Value Date/Time   WBC 5.0 08/21/2021 1910   RBC 3.98 08/21/2021 1910   HGB 13.4 08/21/2021 1910   HCT 40.2 08/21/2021 1910   PLT 225 08/21/2021 1910   MCV 101.0 (H) 08/21/2021 1910   MCH 33.7 08/21/2021 1910   MCHC 33.3 08/21/2021 1910   RDW 11.9 08/21/2021 1910   LYMPHSABS 1.9 12/23/2017 1518   MONOABS 0.3 12/23/2017 1518   EOSABS 0.2 12/23/2017 1518   BASOSABS 0.0 12/23/2017 1518    BMET    Component Value Date/Time   NA 139 08/21/2021 1910   K 4.0 08/21/2021 1910   CL 104 08/21/2021 1910   CO2 29 08/21/2021 1910   GLUCOSE 168 (H) 08/21/2021 1910   BUN 19 08/21/2021 1910   CREATININE 0.88 08/21/2021 1910   CALCIUM 9.6 08/21/2021 1910   GFRNONAA >60 08/21/2021 1910   GFRAA >60 01/18/2020 1055    BNP No results found for: "BNP"  ProBNP No results found for: "PROBNP"  Specialty Problems  None   No Known Allergies  Immunization History  Administered Date(s) Administered   Moderna Sars-Covid-2 Vaccination 07/13/2020, 03/08/2021   PFIZER(Purple Top)SARS-COV-2 Vaccination 11/23/2019, 12/14/2019   PNEUMOCOCCAL CONJUGATE-20 03/19/2021   Pfizer Covid-19 Vaccine Bivalent Booster 59yrs & up 11/12/2021   Td 03/19/2021   Tdap 03/19/2021    Past Medical History:  Diagnosis Date   Deaf    Diabetes mellitus without complication (HCC)    Hypertension     Tobacco History: Social History   Tobacco Use  Smoking Status Former   Years: 35.00   Types: Cigarettes   Quit date: 11/29/2021   Years since quitting: 0.3  Smokeless Tobacco Never   Counseling given: Not Answered   Continue to not smoke  Outpatient Encounter Medications as of 03/27/2022  Medication Sig   albuterol (VENTOLIN HFA) 108  (90 Base) MCG/ACT inhaler Inhale 2 puffs into the lungs every 6 (six) hours as needed for wheezing or shortness of breath.   benzonatate (TESSALON) 100 MG capsule Take 2 capsules (200 mg total) by mouth 3 (three) times daily as needed for cough.   fluticasone furoate-vilanterol (BREO ELLIPTA) 200-25 MCG/ACT AEPB Inhale 1 puff into the lungs daily.   gabapentin (NEURONTIN) 300 MG capsule Take 300 mg by mouth 3 (three) times daily.   lisinopril (ZESTRIL) 5 MG tablet Take 5 mg by mouth daily.   metFORMIN (GLUCOPHAGE) 500 MG tablet Take 1 tablet (500 mg total) by mouth 2 (two) times daily with a meal.   predniSONE (DELTASONE) 20 MG tablet 2 tabs po daily x 4 days (Patient not taking: Reported on 03/27/2022)   rosuvastatin (CRESTOR) 20 MG tablet Take 20 mg by mouth daily.   [DISCONTINUED] albuterol (PROVENTIL HFA;VENTOLIN HFA) 108 (90 Base) MCG/ACT inhaler Inhale 1-2 puffs into the lungs every 6 (six) hours as needed for wheezing or shortness of breath. (Patient not taking: Reported on 12/23/2017)   [DISCONTINUED] dicyclomine (BENTYL) 20 MG tablet Take 1 tablet (20 mg total) by mouth 2 (two) times daily as needed (for abdominal cramping). (Patient not taking: Reported on 12/23/2017)   No facility-administered encounter medications on file as of 03/27/2022.     Review of Systems  Review of Systems  No chest pain.  No orthopnea or PND.  No lower extremity swelling.  Comprehensive review of systems otherwise negative. Physical Exam  BP 102/60 (BP Location: Left Arm)   Pulse 76   Ht 5\' 6"  (1.676 m)   Wt 200 lb 3.2 oz (90.8 kg)   SpO2 99%   BMI 32.31 kg/m   Wt Readings from Last 5 Encounters:  03/27/22 200 lb 3.2 oz (90.8 kg)  08/22/21 198 lb (89.8 kg)  08/21/21 190 lb (86.2 kg)  08/18/21 180 lb (81.6 kg)  09/19/19 203 lb (92.1 kg)    BMI Readings from Last 5 Encounters:  03/27/22 32.31 kg/m  08/22/21 31.96 kg/m  08/21/21 30.67 kg/m  08/18/21 29.05 kg/m  09/19/19 32.77 kg/m      Physical Exam General: Well-appearing, sitting in chair Eyes: EOMI, icterus Neck: Supple, no JVP Pulmonary: Clear, good air movement Cardiovascular: Warm, no edema Abdomen: Nondistended, bowel sounds present MSK: No synovitis, no joint effusion Neuro: Normal gait, no weakness Psych: Normal mood, full affect   Assessment & Plan:   Cough, dyspnea exertion, recurrent bronchitis: 7 ED visits in the calendar year 2023 for similar complaints.  High suspicion for poorly controlled asthma.  Recently started [facility] about 3 weeks ago.  Symptoms greatly improved.  Day-to-day symptoms as  well.  Chest imaging clear as recently as 03/12/2022 per care everywhere.  Asthma: Based on recurrent bronchitis.  Improvement with Breo.  Mild hyperinflation on chest x-ray 08/2021 on my review and interpretation.  Continue high-dose Breo.  Refill today.  New prescription for as needed albuterol today.   Return in about 3 months (around 06/27/2022).   Karren Burly, MD 03/27/2022

## 2022-03-30 LAB — COLOGUARD: COLOGUARD: NEGATIVE

## 2022-04-02 ENCOUNTER — Telehealth: Payer: Self-pay | Admitting: Pulmonary Disease

## 2022-04-02 ENCOUNTER — Other Ambulatory Visit (HOSPITAL_COMMUNITY): Payer: Self-pay

## 2022-04-02 MED ORDER — FLUTICASONE-SALMETEROL 500-50 MCG/ACT IN AEPB: 1.0000 | INHALATION_SPRAY | Freq: Two times a day (BID) | RESPIRATORY_TRACT | 2 refills | Status: DC

## 2022-04-02 NOTE — Telephone Encounter (Signed)
Hey MH,  I got a letter in your in box stating that patients Katelyn Winters was not on formulary. So I ask the pharmacy team to run a claim to see what is covered  It is noted below:  Good morning! I was able to run test claims for other ICS+LABA and they are as follows:   Advair Diskus (brand): $1.45  Symbicort (brand): $1.45   Anything not listed is not formulary.    Please advise sir

## 2022-04-02 NOTE — Telephone Encounter (Signed)
We do not have the from on file to speak with the mom about the visit. The mom is requesting a call back. Please advise.

## 2022-04-02 NOTE — Telephone Encounter (Signed)
Advair diskus 500-50 1 puff BID - thanks

## 2022-04-02 NOTE — Telephone Encounter (Signed)
Updated mother via phone. Nothing further needed.

## 2022-04-02 NOTE — Addendum Note (Signed)
Addended by: Lavetta Nielsen L on: 04/02/2022 01:22 PM   Modules accepted: Orders

## 2022-04-02 NOTE — Telephone Encounter (Signed)
Medication changed per MH. Nothing further needed

## 2022-04-02 NOTE — Telephone Encounter (Signed)
Hey ladies,  I got a message from [employer] that patients Katelyn Winters is not under formulary for her insurance. Can we run a ticket to see what is covered.   They were unable to give me a list of medications that were covered.   Thank you

## 2022-06-25 ENCOUNTER — Encounter: Payer: Self-pay | Admitting: Pulmonary Disease

## 2022-06-25 ENCOUNTER — Ambulatory Visit (INDEPENDENT_AMBULATORY_CARE_PROVIDER_SITE_OTHER): Payer: Self-pay | Admitting: Pulmonary Disease

## 2022-06-25 VITALS — BP 118/66 | HR 83 | Wt 211.2 lb

## 2022-06-25 DIAGNOSIS — J4540 Moderate persistent asthma, uncomplicated: Secondary | ICD-10-CM

## 2022-06-25 MED ORDER — FLUTICASONE-SALMETEROL 500-50 MCG/ACT IN AEPB
1.0000 | INHALATION_SPRAY | Freq: Two times a day (BID) | RESPIRATORY_TRACT | 6 refills | Status: DC
Start: 2022-06-25 — End: 2023-08-21

## 2022-06-25 MED ORDER — ALBUTEROL SULFATE HFA 108 (90 BASE) MCG/ACT IN AERS: 2.0000 | INHALATION_SPRAY | Freq: Four times a day (QID) | RESPIRATORY_TRACT | 11 refills | Status: DC | PRN

## 2022-06-25 NOTE — Patient Instructions (Signed)
Nice to see you again  I am glad the Advair is helping.  This is the round one.  Continue to use this 1 puff twice a day every day.  Rinse your mouth out with water after every use.  Continue use albuterol 2 puffs as needed for shortness of breath.  I refilled both of these medications.  Return to clinic in 6 months or sooner as needed with Dr. Silas Flood

## 2022-06-30 NOTE — Progress Notes (Signed)
@Patient  ID: Katelyn Winters, female    DOB: Dec 22, 1955, 66 y.o.   MRN: [id number]  Chief Complaint  Patient presents with   Follow-up    Follow up for asthma. Pt signs that her asthma is doing well. She states that the adviar is working well. Not having to use her albuterol as much if any anymore.     Referring provider: Mittie Bodo  HPI:   66 y.o. woman whom we are seeing in follow up for evaluation of recurrent bronchitis, cough, dyspnea related to asthma.  ED note 05/2022 reviewed.  Patient presents for scheduled follow-up.  At last visit we discussed at length her recurrent bronchitis, cough and dyspnea.  Likely related to asthma.  She started on Breo.  This was too expensive.  Switch to Advair high-dose.  She is using this 1 puff twice a day.  She finds it very beneficial.  Cough and dyspnea largely improved.  Using albuterol 2 puffs as needed for shortness of breath.  About once a week on average.  Usually during the day with exertion.  She was seen 05/2022 at outside emergency room.  With cough and some shortness of breath.  He was given a prednisone taper.  She did not think this helped.  HPI at initial visit: Patient presents today for evaluation of recurrent cough and shortness of breath.  By my count, 7 ED visits for similar complaints in this calendar year of 2023.  About once a month.  Often improved with steroids sometimes antibiotics.  Per reports, chest images are clear.  She is a former smoker about half pack a day but quit earlier this year with ongoing issues with breathing.  She reports similar issues in the past but much more frequent over the last several months.  Today, she denies much dyspnea.  Not very short of breath.  Her cough is largely improved.  She attributed to starting Breo about 2 to 3 weeks ago.  She does not have rescue inhaler.  No clear triggers to bouts of what sound like bronchitis with prolonged dyspnea and cough.  No other alleviating or  exacerbating factors other than identified above.  Most recent chest x-ray [facility] view 08/2021 demonstrates mild hyperinflation, otherwise clear lungs.  More recent chest x-ray 03/2022 report in care everywhere reveals clear lungs bilaterally.   PMH: Tobacco abuse in remission, hypertension, hyperlipidemia Surgical history: Bladder surgery Family history:History reviewed. No pertinent family history. Social history: Former smoker, quit around March 2023, lives in [place] / Pulmonary Flowsheets:   ACT:  Asthma Control Test ACT Total Score  06/25/2022  2:56 PM 22    MMRC:     No data to display           Epworth:      No data to display           Tests:   FENO:  No results found for: "NITRICOXIDE"  PFT:     No data to display           WALK:      No data to display           Imaging: Personally reviewed and as per EMR discussion this note No results found.  Lab Results: Personally reviewed CBC    Component Value Date/Time   WBC 5.0 08/21/2021 1910   RBC 3.98 08/21/2021 1910   HGB 13.4 08/21/2021 1910   HCT 40.2 08/21/2021 1910   PLT 225  08/21/2021 1910   MCV 101.0 (H) 08/21/2021 1910   MCH 33.7 08/21/2021 1910   MCHC 33.3 08/21/2021 1910   RDW 11.9 08/21/2021 1910   LYMPHSABS 1.9 12/23/2017 1518   MONOABS 0.3 12/23/2017 1518   EOSABS 0.2 12/23/2017 1518   BASOSABS 0.0 12/23/2017 1518    BMET    Component Value Date/Time   NA 139 08/21/2021 1910   K 4.0 08/21/2021 1910   CL 104 08/21/2021 1910   CO2 29 08/21/2021 1910   GLUCOSE 168 (H) 08/21/2021 1910   BUN 19 08/21/2021 1910   CREATININE 0.88 08/21/2021 1910   CALCIUM 9.6 08/21/2021 1910   GFRNONAA >60 08/21/2021 1910   GFRAA >60 01/18/2020 1055    BNP No results found for: "BNP"  ProBNP No results found for: "PROBNP"  Specialty Problems   None   No Known Allergies  Immunization History  Administered Date(s) Administered    Moderna Sars-Covid-2 Vaccination 07/13/2020, 03/08/2021   PFIZER(Purple Top)SARS-COV-2 Vaccination 11/23/2019, 12/14/2019   PNEUMOCOCCAL CONJUGATE-20 03/19/2021   Pfizer Covid-19 Vaccine Bivalent Booster 45yrs & up 11/12/2021   Td 03/19/2021   Tdap 03/19/2021    Past Medical History:  Diagnosis Date   Deaf    Diabetes mellitus without complication (HCC)    Hypertension     Tobacco History: Social History   Tobacco Use  Smoking Status Former   Years: 35.00   Types: Cigarettes   Quit date: 11/29/2021   Years since quitting: 0.5  Smokeless Tobacco Never   Counseling given: Not Answered   Continue to not smoke  Outpatient Encounter Medications as of 06/25/2022  Medication Sig   benzonatate (TESSALON) 100 MG capsule Take 2 capsules (200 mg total) by mouth 3 (three) times daily as needed for cough.   gabapentin (NEURONTIN) 300 MG capsule Take 300 mg by mouth 3 (three) times daily.   lisinopril (ZESTRIL) 5 MG tablet Take 5 mg by mouth daily.   metFORMIN (GLUCOPHAGE) 500 MG tablet Take 1 tablet (500 mg total) by mouth 2 (two) times daily with a meal.   rosuvastatin (CRESTOR) 20 MG tablet Take 20 mg by mouth daily.   [DISCONTINUED] albuterol (VENTOLIN HFA) 108 (90 Base) MCG/ACT inhaler Inhale 2 puffs into the lungs every 6 (six) hours as needed for wheezing or shortness of breath.   [DISCONTINUED] fluticasone-salmeterol (ADVAIR DISKUS) 500-50 MCG/ACT AEPB Inhale 1 puff into the lungs in the morning and at bedtime.   [DISCONTINUED] predniSONE (DELTASONE) 20 MG tablet 2 tabs po daily x 4 days   albuterol (VENTOLIN HFA) 108 (90 Base) MCG/ACT inhaler Inhale 2 puffs into the lungs every 6 (six) hours as needed for wheezing or shortness of breath.   fluticasone-salmeterol (ADVAIR DISKUS) 500-50 MCG/ACT AEPB Inhale 1 puff into the lungs in the morning and at bedtime.   [DISCONTINUED] dicyclomine (BENTYL) 20 MG tablet Take 1 tablet (20 mg total) by mouth 2 (two) times daily as needed (for  abdominal cramping). (Patient not taking: Reported on 12/23/2017)   No facility-administered encounter medications on file as of 06/25/2022.     Review of Systems  Review of Systems  No chest pain.  No orthopnea or PND.  No lower extremity swelling.  Comprehensive review of systems otherwise negative. Physical Exam  BP 118/66 (BP Location: Left Arm, Patient Position: Sitting, Cuff Size: Normal)   Pulse 83   Wt 211 lb 3.2 oz (95.8 kg)   SpO2 94%   BMI 34.09 kg/m   Wt Readings from Last 5 Encounters:  06/25/22 211 lb 3.2 oz (95.8 kg)  03/27/22 200 lb 3.2 oz (90.8 kg)  08/22/21 198 lb (89.8 kg)  08/21/21 190 lb (86.2 kg)  08/18/21 180 lb (81.6 kg)    BMI Readings from Last 5 Encounters:  06/25/22 34.09 kg/m  03/27/22 32.31 kg/m  08/22/21 31.96 kg/m  08/21/21 30.67 kg/m  08/18/21 29.05 kg/m     Physical Exam General: Well-appearing, sitting in chair Eyes: EOMI, icterus Neck: Supple, no JVP Pulmonary: Clear, good air movement Cardiovascular: Warm, no edema Abdomen: Nondistended, bowel sounds present MSK: No synovitis, no joint effusion Neuro: Normal gait, no weakness Psych: Normal mood, full affect   Assessment & Plan:   Cough, dyspnea exertion, recurrent bronchitis: 8 ED visits in the calendar year 2023 for similar complaints.  High suspicion for poorly controlled asthma.  Initially improved with Breo, now on Advair due to cost.  Symptoms greatly improved.  However has had 1 additional ED visit since this intervention.  Consider escalation to triple inhaled therapy if this occurs again or needs prednisone again.  Asthma: Based on recurrent bronchitis.  Improvement with Breo and Advair.  Mild hyperinflation on chest x-ray 08/2021 on my review and interpretation.  Continue high-dose Advair discus 1 puff twice a day.  Advair and albuterol refilled today.    Return in about 6 months (around 12/25/2022).   Karren Burly, MD 06/30/2022

## 2023-04-27 ENCOUNTER — Ambulatory Visit: Payer: Medicaid - Out of State | Admitting: Pulmonary Disease

## 2023-05-19 ENCOUNTER — Encounter: Payer: Self-pay | Admitting: Pulmonary Disease

## 2023-07-31 IMAGING — CR DG CHEST 2V
2 series · 2 of 2 positions shown · non-contrast
Comparison: 10/14/2020

CLINICAL DATA: Chest pain

EXAM:
CHEST - 2 VIEW

[chest pa]
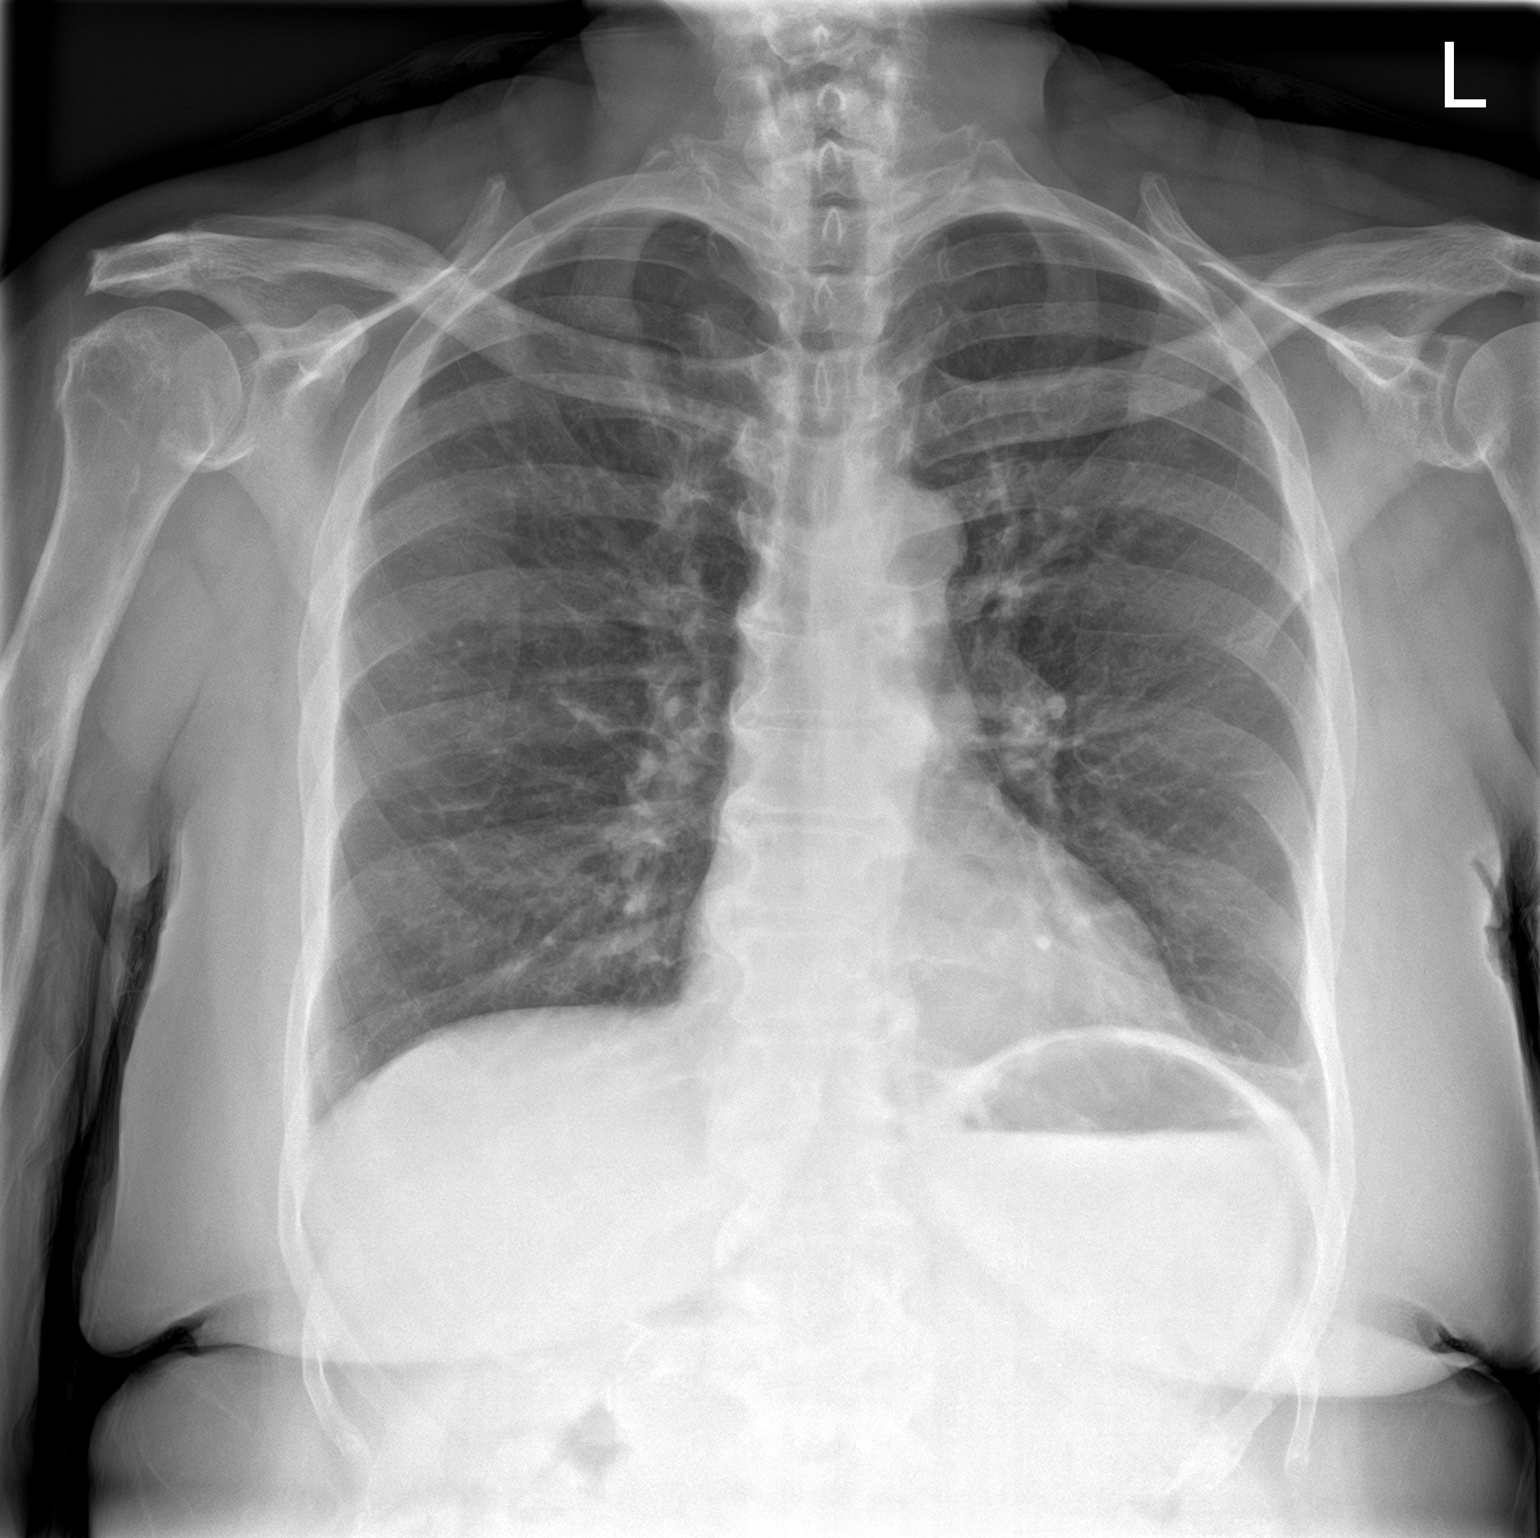

[chest lat]
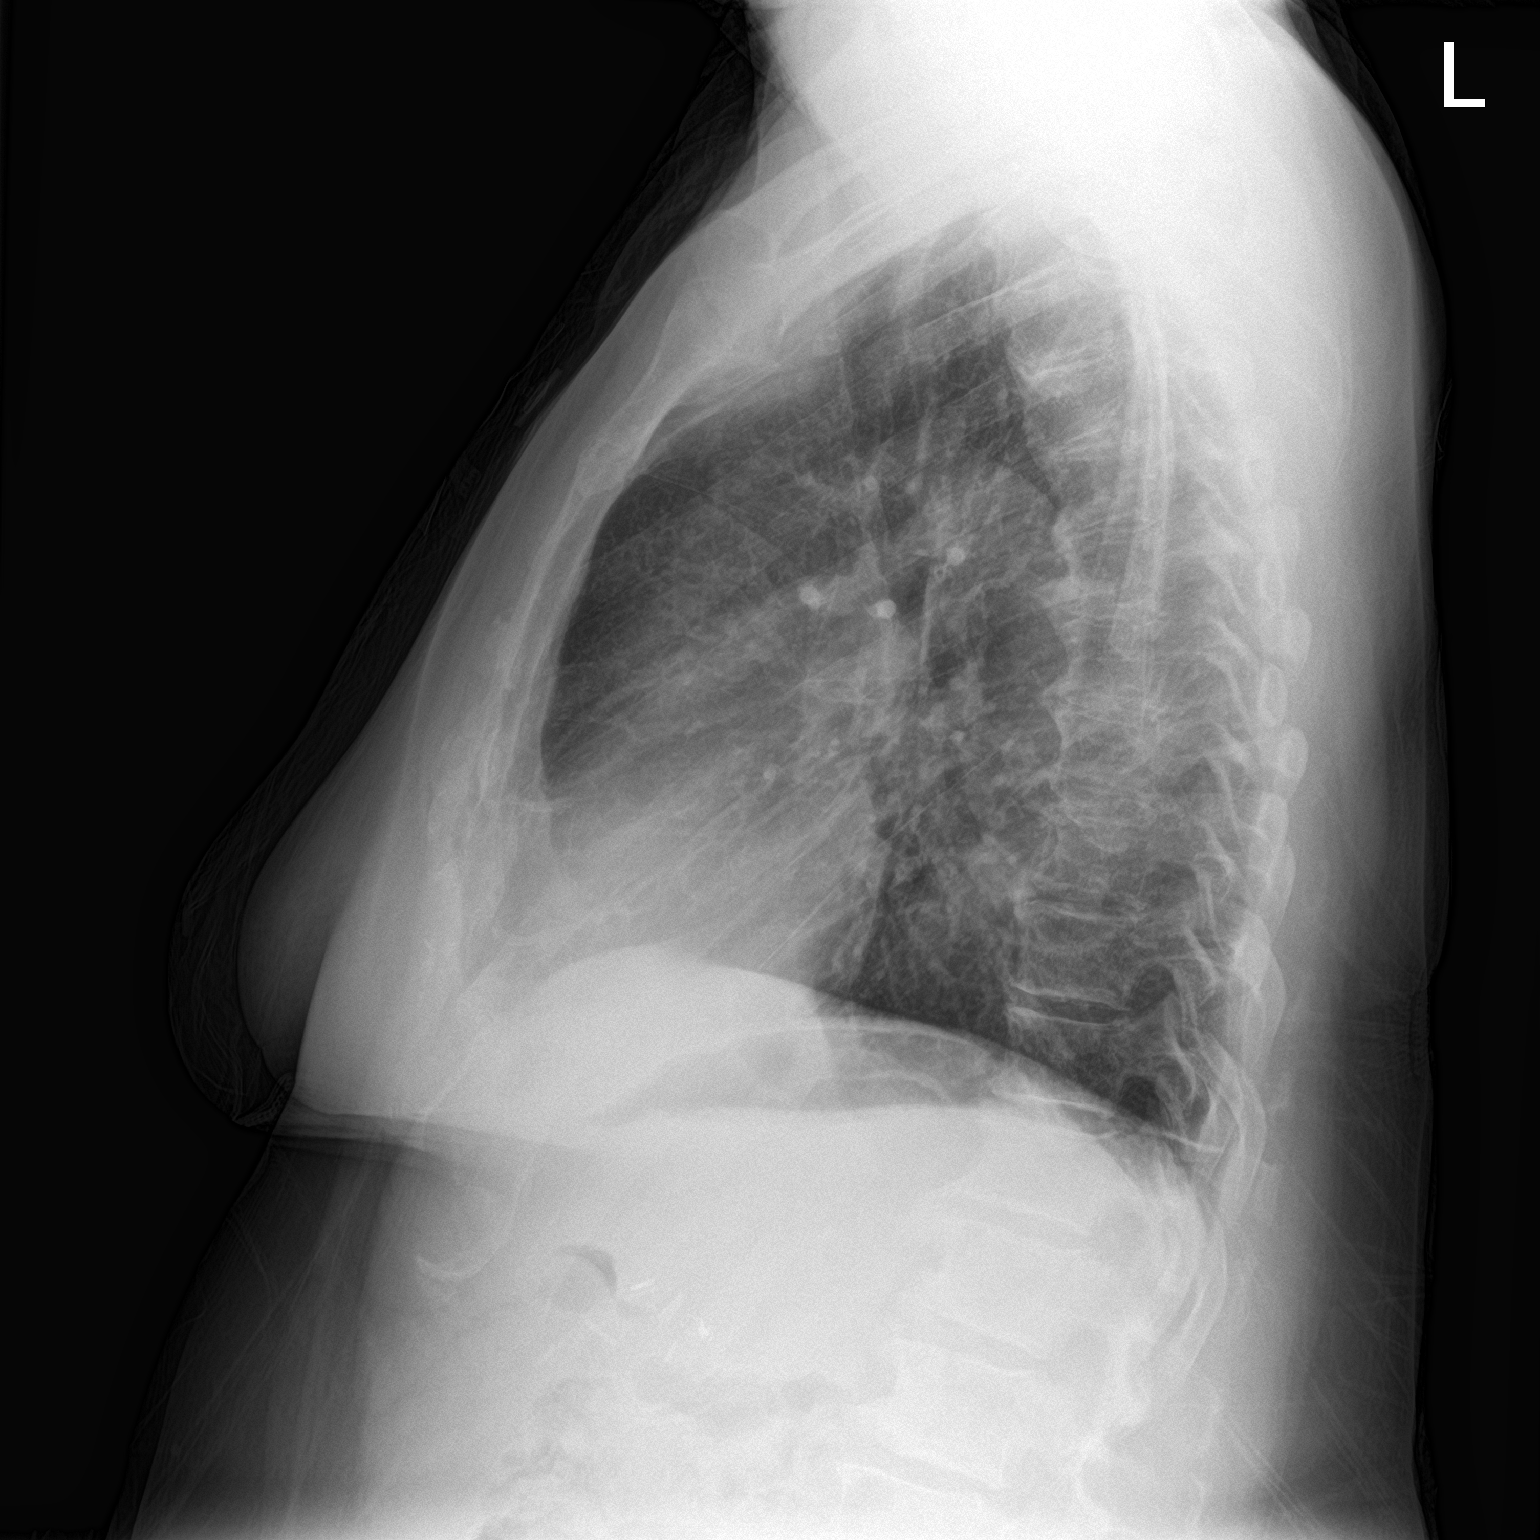

[2 of 2 positions shown; findings below may reference images not displayed]

FINDINGS: Lungs are well expanded, symmetric, and clear. No pneumothorax or
pleural effusion. Cardiac size within normal limits. Pulmonary
vascularity is normal. Osseous structures are age-appropriate. No
acute bone abnormality.
IMPRESSION: No active cardiopulmonary disease.

## 2023-08-01 IMAGING — DX DG CHEST 2V
2 series · 2 of 2 positions shown · non-contrast
Comparison: 08/21/2021.

CLINICAL DATA: Cough.

EXAM:
CHEST - 2 VIEW

[w chest pa]
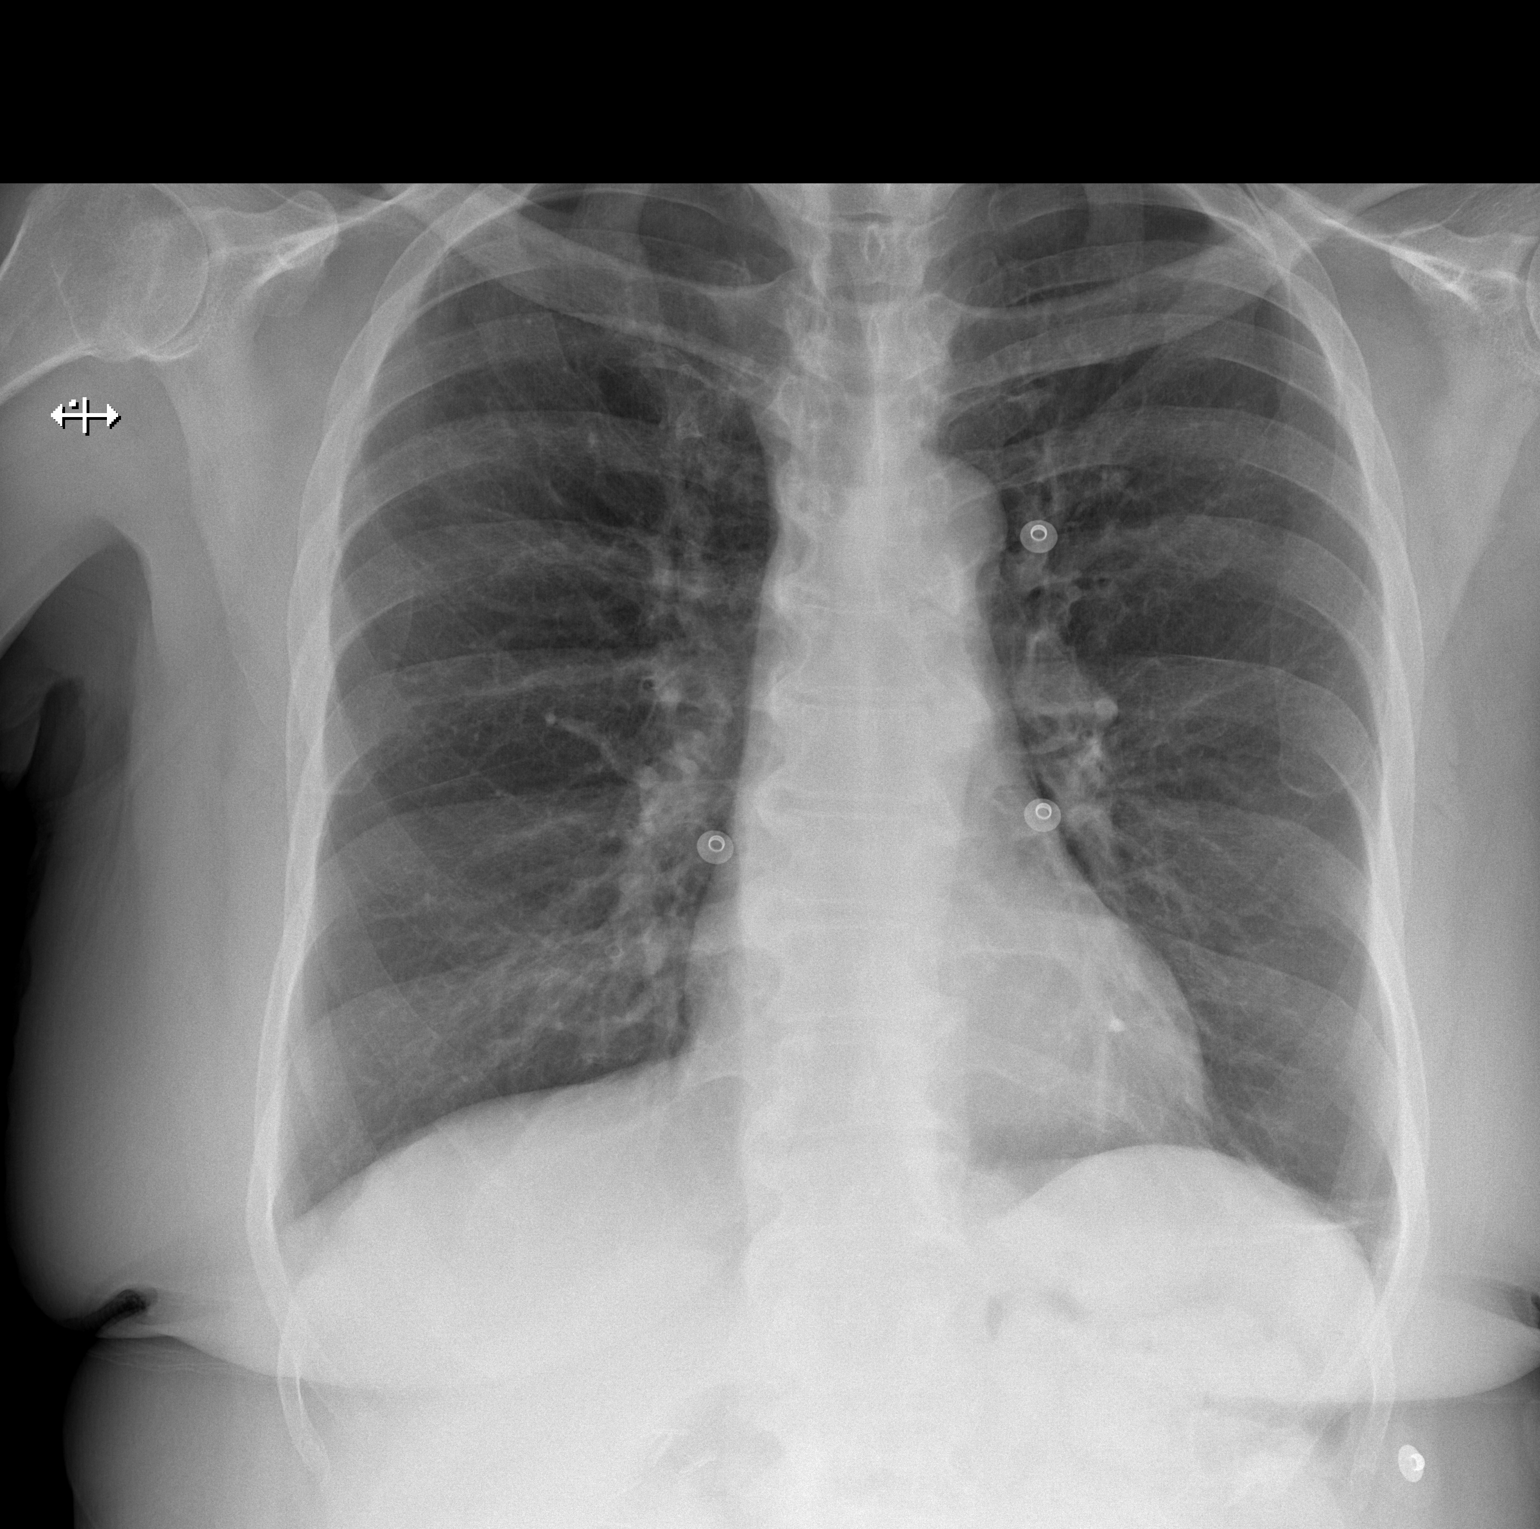

[w chest lat]
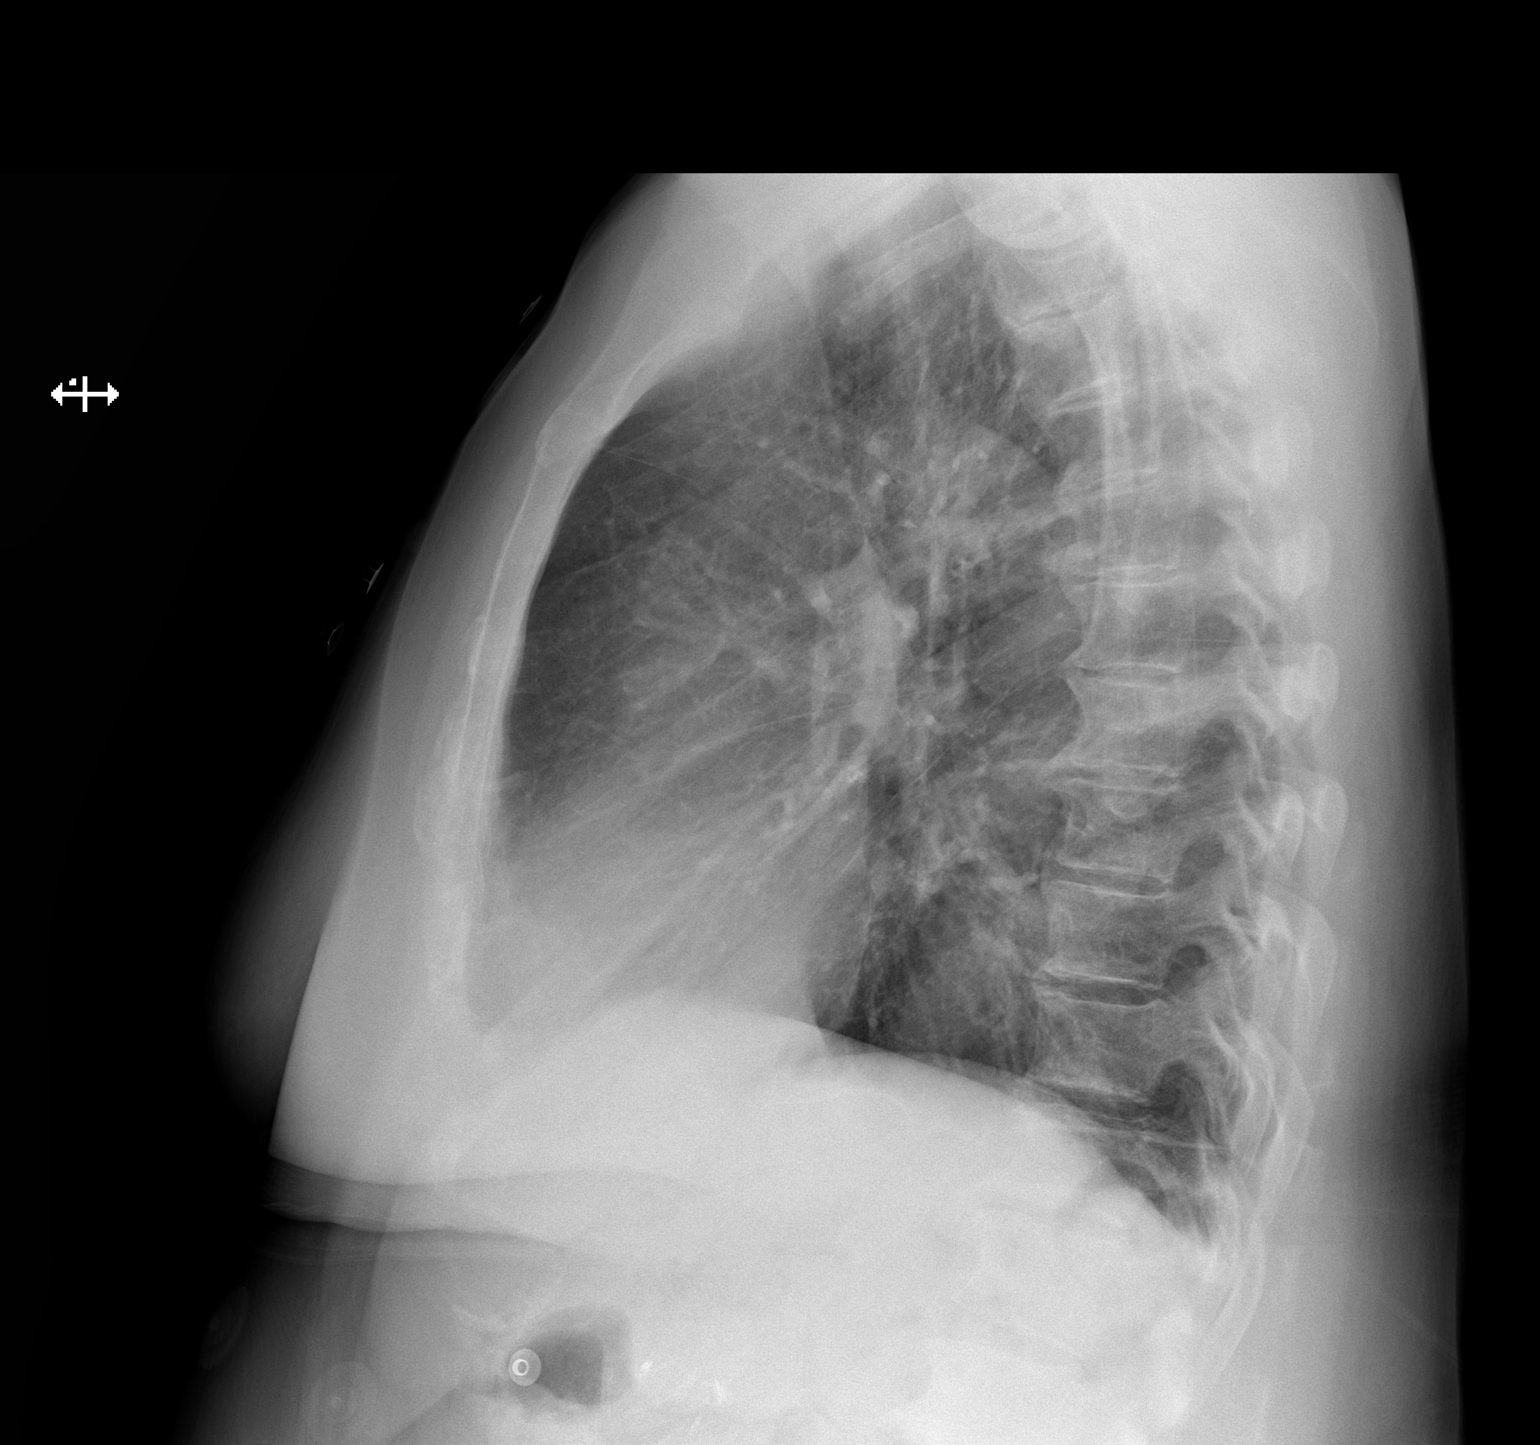

[2 of 2 positions shown; findings below may reference images not displayed]

FINDINGS: The heart size and mediastinal contours are within normal limits.
Atherosclerotic calcification of the aorta. Mild subsegmental
atelectasis noted at the left lung base. No consolidation, effusion,
or pneumothorax. Degenerative changes are noted in the thoracic
spine. No acute osseous abnormality. Surgical clips are identified
in the right upper quadrant.
IMPRESSION: No active cardiopulmonary disease.

## 2023-08-21 ENCOUNTER — Ambulatory Visit: Payer: Medicare Other | Admitting: Pulmonary Disease

## 2023-08-21 ENCOUNTER — Encounter: Payer: Self-pay | Admitting: Pulmonary Disease

## 2023-08-21 VITALS — BP 122/83 | HR 103 | Ht 66.0 in | Wt 203.6 lb

## 2023-08-21 DIAGNOSIS — R0609 Other forms of dyspnea: Secondary | ICD-10-CM

## 2023-08-21 DIAGNOSIS — J45909 Unspecified asthma, uncomplicated: Secondary | ICD-10-CM

## 2023-08-21 DIAGNOSIS — J4540 Moderate persistent asthma, uncomplicated: Secondary | ICD-10-CM

## 2023-08-21 MED ORDER — ALBUTEROL SULFATE (2.5 MG/3ML) 0.083% IN NEBU: 2.5000 mg | INHALATION_SOLUTION | Freq: Four times a day (QID) | RESPIRATORY_TRACT | 6 refills | Status: DC | PRN

## 2023-08-21 MED ORDER — PREDNISONE 20 MG PO TABS: ORAL_TABLET | ORAL | 0 refills | Status: AC

## 2023-08-21 MED ORDER — TRELEGY ELLIPTA 200-62.5-25 MCG/ACT IN AEPB: 1.0000 | INHALATION_SPRAY | Freq: Every day | RESPIRATORY_TRACT | 11 refills | Status: DC

## 2023-08-21 NOTE — Patient Instructions (Signed)
Nice to see you again  I think the asthma is not well-controlled  I prescribed a new inhaler called Trelegy.  Take 1 puff once a day.  Rinse your mouth out with water after every use  Stop taking Advair once you start the Trelegy.  I provided samples of Trelegy, each sample will last 2 weeks, 14 doses.  If the Trelegy is too expensive please call the office and let me know and we will look for a solution.  Let me know if the samples of Trelegy help so we know what best to do with the inhalers in the future.  I sent a course of prednisone to your pharmacy.  Take as prescribed.  40 mg once a day for 5 days and 20 mg once a day for 5 days and stop.  I refilled the albuterol nebulizer solution.  Return to clinic in 3 months or sooner as needed with Dr. Judeth Horn

## 2023-08-21 NOTE — Progress Notes (Signed)
@Patient  ID: Katelyn Winters, female    DOB: 08-Feb-1956, 67 y.o.   MRN: [id number]  Chief Complaint  Patient presents with   Follow-up    Pt states she needs a refill of neb ( cheaper ), using advair. More wheezing since last visit     Referring provider: Larkin Ina  HPI:   67 y.o. woman whom we are seeing in follow up for evaluation of recurrent bronchitis, cough, dyspnea related to asthma.  Multiple ED notes and primary care notes reviewed in the interim since last visit.  History obtained with the assistance of an in person sign language interpreter.  Patient well overdue for follow-up.  Last seen 14 months ago.  Multiple ED visits at least 3 for asthma exacerbations.  Seen by PCP on multiple occasions for similar.  Currently on high-dose Advair.  Using rescue inhaler and nebulized albuterol frequently.  Unclear why she never followed up with me.  On exam she is wheezy.  She describes worsening breathing overall.  We discussed given the severity of disease she warrants triple inhaled therapy.  For Trelegy today.  Samples as well.  Also given ongoing worsening symptoms over the last several days concern for active asthma exacerbation.  With wheezing on exam.  Prednisone course as well.  HPI at initial visit: Patient presents today for evaluation of recurrent cough and shortness of breath.  By my count, 7 ED visits for similar complaints in this calendar year of 2023.  About once a month.  Often improved with steroids sometimes antibiotics.  Per reports, chest images are clear.  She is a former smoker about half pack a day but quit earlier this year with ongoing issues with breathing.  She reports similar issues in the past but much more frequent over the last several months.  Today, she denies much dyspnea.  Not very short of breath.  Her cough is largely improved.  She attributed to starting Breo about 2 to 3 weeks ago.  She does not have rescue inhaler.  No clear triggers to  bouts of what sound like bronchitis with prolonged dyspnea and cough.  No other alleviating or exacerbating factors other than identified above.  Most recent chest x-ray [facility] view 08/2021 demonstrates mild hyperinflation, otherwise clear lungs.  More recent chest x-ray 03/2022 report in care everywhere reveals clear lungs bilaterally.   PMH: Tobacco abuse in remission, hypertension, hyperlipidemia Surgical history: Bladder surgery Family history:History reviewed. No pertinent family history. Social history: Former smoker, quit around March 2023, lives in [facility]  Questionaires / Pulmonary Flowsheets:   ACT:  Asthma Control Test ACT Total Score  06/25/2022  2:56 PM 22    MMRC:     No data to display          Epworth:      No data to display          Tests:   FENO:  No results found for: "NITRICOXIDE"  PFT:     No data to display          WALK:      No data to display          Imaging: Personally reviewed and as per EMR discussion this note No results found.  Lab Results: Personally reviewed CBC    Component Value Date/Time   WBC 5.0 08/21/2021 1910   RBC 3.98 08/21/2021 1910   HGB 13.4 08/21/2021 1910   HCT 40.2 08/21/2021 1910   PLT 225  08/21/2021 1910   MCV 101.0 (H) 08/21/2021 1910   MCH 33.7 08/21/2021 1910   MCHC 33.3 08/21/2021 1910   RDW 11.9 08/21/2021 1910   LYMPHSABS 1.9 12/23/2017 1518   MONOABS 0.3 12/23/2017 1518   EOSABS 0.2 12/23/2017 1518   BASOSABS 0.0 12/23/2017 1518    BMET    Component Value Date/Time   NA 139 08/21/2021 1910   K 4.0 08/21/2021 1910   CL 104 08/21/2021 1910   CO2 29 08/21/2021 1910   GLUCOSE 168 (H) 08/21/2021 1910   BUN 19 08/21/2021 1910   CREATININE 0.88 08/21/2021 1910   CALCIUM 9.6 08/21/2021 1910   GFRNONAA >60 08/21/2021 1910   GFRAA >60 01/18/2020 1055    BNP No results found for: "BNP"  ProBNP No results found for: "PROBNP"  Specialty Problems    None   No Known Allergies  Immunization History  Administered Date(s) Administered   Influenza-Unspecified 05/03/2023   Moderna Sars-Covid-2 Vaccination 07/13/2020, 03/08/2021   PFIZER(Purple Top)SARS-COV-2 Vaccination 11/23/2019, 12/14/2019   PNEUMOCOCCAL CONJUGATE-20 03/19/2021   Pfizer Covid-19 Vaccine Bivalent Booster 59yrs & up 11/12/2021   Pfizer(Comirnaty)Fall Seasonal Vaccine 12 years and older 10/17/2022   Td 03/19/2021   Tdap 03/19/2021    Past Medical History:  Diagnosis Date   Deaf    Diabetes mellitus without complication (HCC)    Hypertension     Tobacco History: Social History   Tobacco Use  Smoking Status Former   Current packs/day: 0.00   Types: Cigarettes   Start date: 11/30/1986   Quit date: 11/29/2021   Years since quitting: 1.7  Smokeless Tobacco Never   Counseling given: Not Answered   Continue to not smoke  Outpatient Encounter Medications as of 08/21/2023  Medication Sig   albuterol (PROVENTIL) (2.5 MG/3ML) 0.083% nebulizer solution Take 3 mLs (2.5 mg total) by nebulization every 6 (six) hours as needed for wheezing or shortness of breath.   albuterol (VENTOLIN HFA) 108 (90 Base) MCG/ACT inhaler Inhale 2 puffs into the lungs every 6 (six) hours as needed for wheezing or shortness of breath.   benzonatate (TESSALON) 100 MG capsule Take 2 capsules (200 mg total) by mouth 3 (three) times daily as needed for cough.   Fluticasone-Umeclidin-Vilant (TRELEGY ELLIPTA) 200-62.5-25 MCG/ACT AEPB Inhale 1 puff into the lungs daily.   gabapentin (NEURONTIN) 300 MG capsule Take 300 mg by mouth 3 (three) times daily.   lisinopril (ZESTRIL) 5 MG tablet Take 5 mg by mouth daily.   metFORMIN (GLUCOPHAGE) 500 MG tablet Take 1 tablet (500 mg total) by mouth 2 (two) times daily with a meal.   predniSONE (DELTASONE) 20 MG tablet Take 2 tablets (40 mg total) by mouth daily with breakfast for 5 days, THEN 1 tablet (20 mg total) daily with breakfast for 5 days.    rosuvastatin (CRESTOR) 20 MG tablet Take 20 mg by mouth daily.   [DISCONTINUED] fluticasone-salmeterol (ADVAIR DISKUS) 500-50 MCG/ACT AEPB Inhale 1 puff into the lungs in the morning and at bedtime.   [DISCONTINUED] dicyclomine (BENTYL) 20 MG tablet Take 1 tablet (20 mg total) by mouth 2 (two) times daily as needed (for abdominal cramping). (Patient not taking: Reported on 12/23/2017)   No facility-administered encounter medications on file as of 08/21/2023.     Review of Systems  Review of Systems  N/a Physical Exam  BP 122/83   Pulse (!) 103   Ht 5\' 6"  (1.676 m)   Wt 203 lb 9.6 oz (92.4 kg)   SpO2 95%  BMI 32.86 kg/m   Wt Readings from Last 5 Encounters:  08/21/23 203 lb 9.6 oz (92.4 kg)  06/25/22 211 lb 3.2 oz (95.8 kg)  03/27/22 200 lb 3.2 oz (90.8 kg)  08/22/21 198 lb (89.8 kg)  08/21/21 190 lb (86.2 kg)    BMI Readings from Last 5 Encounters:  08/21/23 32.86 kg/m  06/25/22 34.09 kg/m  03/27/22 32.31 kg/m  08/22/21 31.96 kg/m  08/21/21 30.67 kg/m     Physical Exam General: Well-appearing, sitting in chair Eyes: EOMI, icterus Neck: Supple, no JVP Pulmonary: Diffuse mild end expiratory wheeze, normal work of breathing on room air Cardiovascular: Warm, no edema Abdomen: Nondistended, bowel sounds present MSK: No synovitis, no joint effusion Neuro: Normal gait, no weakness Psych: Normal mood, full affect   Assessment & Plan:   Cough, dyspnea exertion, recurrent bronchitis: 8 ED visits in the calendar year 2023 for similar complaints.  High suspicion for poorly controlled asthma.  Initially improved with Breo, now on Advair due to cost.  Symptoms initially greatly improved with improved symptoms.  However she has had multiple ED visits at least 3 in the calendar year 2024 since I last saw her.  Escalate asthma therapy as below.  Asthma: Based on recurrent bronchitis.  Improvement with Breo and Advair.  Mild hyperinflation on chest x-ray 08/2021 on my review  and interpretation.  Initially better control with Advair high-dose.  Now with worsening.  Escalate to high-dose Trelegy, stop Advair.  Prednisone taper today.  Albuterol nebulizer solution refilled today.   Return in about 3 months (around 11/19/2023) for f/u Dr. Judeth Horn.   Karren Burly, MD 08/21/2023

## 2023-11-12 ENCOUNTER — Ambulatory Visit: Payer: Medicare Other | Admitting: Pulmonary Disease

## 2023-11-12 ENCOUNTER — Other Ambulatory Visit (HOSPITAL_COMMUNITY): Payer: Self-pay

## 2023-11-12 ENCOUNTER — Telehealth: Payer: Self-pay | Admitting: Pulmonary Disease

## 2023-11-12 ENCOUNTER — Telehealth: Payer: Self-pay

## 2023-11-12 MED ORDER — ALBUTEROL SULFATE HFA 108 (90 BASE) MCG/ACT IN AERS: 2.0000 | INHALATION_SPRAY | Freq: Four times a day (QID) | RESPIRATORY_TRACT | 6 refills | Status: DC | PRN

## 2023-11-12 MED ORDER — TRELEGY ELLIPTA 200-62.5-25 MCG/ACT IN AEPB: 1.0000 | INHALATION_SPRAY | Freq: Every day | RESPIRATORY_TRACT | 6 refills | Status: DC

## 2023-11-12 MED ORDER — ALBUTEROL SULFATE (2.5 MG/3ML) 0.083% IN NEBU: 2.5000 mg | INHALATION_SOLUTION | Freq: Four times a day (QID) | RESPIRATORY_TRACT | 6 refills | Status: DC | PRN

## 2023-11-12 NOTE — Telephone Encounter (Signed)
 Trelegy, albuterol HFA and albuterol solution has been sent to preferred pharmacy.  Left message via interpreter.

## 2023-11-12 NOTE — Telephone Encounter (Signed)
 Pharmacy Patient Advocate Encounter  Received notification from [facility] that Prior Authorization for Albuterol Sulfate (2.5 MG/3ML)0.083% nebulizer solution  has been CANCELLED due to medication is covered under Medicare part B. Script must be sent with ICD 10 code in order for pharmacy to process    PA #/Case ID/Reference #: [id number]

## 2023-11-12 NOTE — Telephone Encounter (Signed)
 [facility] [employer]  Pt states she needs refills on Albuterol Neb and inhaler & Trelegy. She is coming in today.

## 2023-11-13 NOTE — Telephone Encounter (Signed)
 Spoke to patient via interpreter. He is aware that Rx have been sent in. He is aware and voiced his understanding.  Nothing further needed.

## 2023-12-01 ENCOUNTER — Encounter: Payer: Self-pay | Admitting: Pulmonary Disease

## 2023-12-01 ENCOUNTER — Ambulatory Visit (INDEPENDENT_AMBULATORY_CARE_PROVIDER_SITE_OTHER): Admitting: Pulmonary Disease

## 2023-12-01 VITALS — BP 123/78 | HR 81 | Ht 66.0 in | Wt 208.0 lb

## 2023-12-01 DIAGNOSIS — J4550 Severe persistent asthma, uncomplicated: Secondary | ICD-10-CM

## 2023-12-01 MED ORDER — ALBUTEROL SULFATE HFA 108 (90 BASE) MCG/ACT IN AERS
2.0000 | INHALATION_SPRAY | Freq: Four times a day (QID) | RESPIRATORY_TRACT | 12 refills | Status: AC | PRN
Start: 2023-12-01 — End: ?

## 2023-12-01 MED ORDER — TRELEGY ELLIPTA 200-62.5-25 MCG/ACT IN AEPB: 1.0000 | INHALATION_SPRAY | Freq: Every day | RESPIRATORY_TRACT | 12 refills | Status: AC

## 2023-12-01 MED ORDER — ALBUTEROL SULFATE (2.5 MG/3ML) 0.083% IN NEBU
2.5000 mg | INHALATION_SOLUTION | Freq: Four times a day (QID) | RESPIRATORY_TRACT | 11 refills | Status: AC | PRN
Start: 2023-12-01 — End: ?

## 2023-12-01 NOTE — Patient Instructions (Signed)
 Nice to see you again  I refilled the Trelegy 1 puff once a day, rinse your mouth out with water after every use  I refilled your albuterol rescue inhaler 2 puffs every 4-6 hours as needed for shortness of breath or wheeze or cough  I refilled the albuterol nebulizer solution, the medicine you put in the nebulizer machine, use every 4-6 hours as needed for shortness of breath or wheeze or cough  Return to clinic in 6 months or sooner as needed with Dr. Judeth Horn

## 2023-12-01 NOTE — Progress Notes (Signed)
 @Patient  ID: Katelyn Winters, female    DOB: July 07, 1956, 68 y.o.   MRN: [id number]  Chief Complaint  Patient presents with   Follow-up    Pt states needs refills     Referring provider: Larkin Ina  HPI:   68 y.o. woman whom we are seeing in follow up for evaluation of recurrent bronchitis, cough, dyspnea related to asthma.  Multiple telephone encounters in the interim since last visit reviewed.  Patient returns for routine follow-up.  Overall doing quite well.  Last seen 3 months ago.  At time of initial consult she had been seen in the ED nearly 10 times in a year.  She was placed on Advair.  Followed up 14 months later, 08/2023.  She had been in the ED 3 times over that time span, improved but too much.  This prompted escalation to Trelegy high-dose.  Over the last 3 months no further visits to the emergency room.  The breathing feels well.  She states she is out of the medication, no more refills.  She also request refills of albuterol HFA and nebulizer solution.  HPI at initial visit: Patient presents today for evaluation of recurrent cough and shortness of breath.  By my count, 7 ED visits for similar complaints in this calendar year of 2023.  About once a month.  Often improved with steroids sometimes antibiotics.  Per reports, chest images are clear.  She is a former smoker about half pack a day but quit earlier this year with ongoing issues with breathing.  She reports similar issues in the past but much more frequent over the last several months.  Today, she denies much dyspnea.  Not very short of breath.  Her cough is largely improved.  She attributed to starting Breo about 2 to 3 weeks ago.  She does not have rescue inhaler.  No clear triggers to bouts of what sound like bronchitis with prolonged dyspnea and cough.  No other alleviating or exacerbating factors other than identified above.  Most recent chest x-ray [place] view 08/2021 demonstrates mild hyperinflation,  otherwise clear lungs.  More recent chest x-ray 03/2022 report in care everywhere reveals clear lungs bilaterally.   PMH: Tobacco abuse in remission, hypertension, hyperlipidemia Surgical history: Bladder surgery Family history:History reviewed. No pertinent family history. Social history: Former smoker, quit around March 2023, lives in [facility]  Questionaires / Pulmonary Flowsheets:   ACT:  Asthma Control Test ACT Total Score  06/25/2022  2:56 PM 22    MMRC:     No data to display          Epworth:      No data to display          Tests:   FENO:  No results found for: "NITRICOXIDE"  PFT:     No data to display          WALK:      No data to display          Imaging: Personally reviewed and as per EMR discussion this note No results found.  Lab Results: Personally reviewed CBC    Component Value Date/Time   WBC 5.0 08/21/2021 1910   RBC 3.98 08/21/2021 1910   HGB 13.4 08/21/2021 1910   HCT 40.2 08/21/2021 1910   PLT 225 08/21/2021 1910   MCV 101.0 (H) 08/21/2021 1910   MCH 33.7 08/21/2021 1910   MCHC 33.3 08/21/2021 1910   RDW 11.9 08/21/2021 1910  LYMPHSABS 1.9 12/23/2017 1518   MONOABS 0.3 12/23/2017 1518   EOSABS 0.2 12/23/2017 1518   BASOSABS 0.0 12/23/2017 1518    BMET    Component Value Date/Time   NA 139 08/21/2021 1910   K 4.0 08/21/2021 1910   CL 104 08/21/2021 1910   CO2 29 08/21/2021 1910   GLUCOSE 168 (H) 08/21/2021 1910   BUN 19 08/21/2021 1910   CREATININE 0.88 08/21/2021 1910   CALCIUM 9.6 08/21/2021 1910   GFRNONAA >60 08/21/2021 1910   GFRAA >60 01/18/2020 1055    BNP No results found for: "BNP"  ProBNP No results found for: "PROBNP"  Specialty Problems   None   No Known Allergies  Immunization History  Administered Date(s) Administered   Influenza-Unspecified 05/03/2023   Moderna Sars-Covid-2 Vaccination 07/13/2020, 03/08/2021   PFIZER(Purple Top)SARS-COV-2 Vaccination  11/23/2019, 12/14/2019   PNEUMOCOCCAL CONJUGATE-20 03/19/2021   Pfizer Covid-19 Vaccine Bivalent Booster 74yrs & up 11/12/2021   Pfizer(Comirnaty)Fall Seasonal Vaccine 12 years and older 10/17/2022   Td 03/19/2021   Tdap 03/19/2021    Past Medical History:  Diagnosis Date   Deaf    Diabetes mellitus without complication (HCC)    Hypertension     Tobacco History: Social History   Tobacco Use  Smoking Status Former   Current packs/day: 0.00   Types: Cigarettes   Start date: 11/30/1986   Quit date: 11/29/2021   Years since quitting: 2.0  Smokeless Tobacco Never   Counseling given: Not Answered   Continue to not smoke  Outpatient Encounter Medications as of 12/01/2023  Medication Sig   benzonatate (TESSALON) 100 MG capsule Take 2 capsules (200 mg total) by mouth 3 (three) times daily as needed for cough.   gabapentin (NEURONTIN) 300 MG capsule Take 300 mg by mouth 3 (three) times daily.   lisinopril (ZESTRIL) 5 MG tablet Take 5 mg by mouth daily.   metFORMIN (GLUCOPHAGE) 500 MG tablet Take 1 tablet (500 mg total) by mouth 2 (two) times daily with a meal.   rosuvastatin (CRESTOR) 20 MG tablet Take 20 mg by mouth daily.   [DISCONTINUED] albuterol (PROVENTIL) (2.5 MG/3ML) 0.083% nebulizer solution Take 3 mLs (2.5 mg total) by nebulization every 6 (six) hours as needed for wheezing or shortness of breath.   [DISCONTINUED] albuterol (VENTOLIN HFA) 108 (90 Base) MCG/ACT inhaler Inhale 2 puffs into the lungs every 6 (six) hours as needed for wheezing or shortness of breath.   [DISCONTINUED] Fluticasone-Umeclidin-Vilant (TRELEGY ELLIPTA) 200-62.5-25 MCG/ACT AEPB Inhale 1 puff into the lungs daily.   albuterol (PROVENTIL) (2.5 MG/3ML) 0.083% nebulizer solution Take 3 mLs (2.5 mg total) by nebulization every 6 (six) hours as needed for wheezing or shortness of breath.   albuterol (VENTOLIN HFA) 108 (90 Base) MCG/ACT inhaler Inhale 2 puffs into the lungs every 6 (six) hours as needed for  wheezing or shortness of breath.   Fluticasone-Umeclidin-Vilant (TRELEGY ELLIPTA) 200-62.5-25 MCG/ACT AEPB Inhale 1 puff into the lungs daily.   [DISCONTINUED] dicyclomine (BENTYL) 20 MG tablet Take 1 tablet (20 mg total) by mouth 2 (two) times daily as needed (for abdominal cramping). (Patient not taking: Reported on 12/23/2017)   No facility-administered encounter medications on file as of 12/01/2023.     Review of Systems  Review of Systems  N/a Physical Exam  BP 123/78 (BP Location: Left Arm, Patient Position: Sitting, Cuff Size: Normal)   Pulse 81   Ht 5\' 6"  (1.676 m)   Wt 208 lb (94.3 kg)   SpO2 96%  BMI 33.57 kg/m   Wt Readings from Last 5 Encounters:  12/01/23 208 lb (94.3 kg)  08/21/23 203 lb 9.6 oz (92.4 kg)  06/25/22 211 lb 3.2 oz (95.8 kg)  03/27/22 200 lb 3.2 oz (90.8 kg)  08/22/21 198 lb (89.8 kg)    BMI Readings from Last 5 Encounters:  12/01/23 33.57 kg/m  08/21/23 32.86 kg/m  06/25/22 34.09 kg/m  03/27/22 32.31 kg/m  08/22/21 31.96 kg/m     Physical Exam General: Well-appearing, sitting in chair Eyes: EOMI, icterus Neck: Supple, no JVP Pulmonary: Clear, improved from prior, normal work of breathing on room air Cardiovascular: Warm, no edema Abdomen: Nondistended, bowel sounds present MSK: No synovitis, no joint effusion Neuro: Normal gait, no weakness Psych: Normal mood, full affect   Assessment & Plan:   Cough, dyspnea exertion, recurrent bronchitis: 8 ED visits in the calendar year 2023 for similar complaints.  High suspicion for poorly controlled asthma.  Initially improved with Breo, now on Advair due to cost.  Symptoms initially greatly improved with improved symptoms.  However she had multiple ED visits at least 3 in the calendar year 2024 since since starting high-dose ICS/LABA therapy.  She was escalated to Trelegy at last visit.  No further ED visits.  Lung exam improved, less wheezy.  Asthma: Based on recurrent bronchitis.   Improvement with Breo and Advair.  Mild hyperinflation on chest x-ray 08/2021 on my review and interpretation.  Initially better control with Advair high-dose.  Now with worsening.  Escalated to high-dose Trelegy at last visit with markedly improved symptoms.  Trelegy refilled today.  Albuterol HFA and nebulizer refilled today.   Return in about 6 months (around 06/01/2024) for f/u Dr. Judeth Horn.   Karren Burly, MD 12/01/2023

## 2024-05-27 NOTE — Progress Notes (Signed)
 Patient: Katelyn Winters  DOB: 05-07-1956, age 68 y.o. MRN: [id number]  PCP: Lauraine Patient, PA  Assessment and Plan   1. Chronic obstructive pulmonary disease, unspecified COPD type (*)  fluticasone -salmeterol (ADVAIR DISKUS/WIXELA) 500-50 mcg/dose AEPB inhalation powder    2. Type 2 diabetes mellitus without complication, without long-term current use of insulin (*)      3. Noncompliance with medication regimen      4. Bilateral deafness      5. Sign Supported [place] language interpreter needed        Assessment & Plan 1. Asthma: - She has had 3 hospital visits in the past 2 months due to asthma exacerbations. - Her lungs sound pretty good, but wheezing was noted during deep breathing. She is currently on prednisone . - She was advised to resume her Advair (fluticasone /salmeterol) regimen, taking 1 puff in the morning and 1 puff at night. She was also instructed to use her rescue inhaler every 4 hours as needed. A prescription for Advair was provided to be filled at the pharmacy. If her breathing does not improve or if she has another ER visit, an alternative treatment plan will be considered in November.  D/w pt the importance of maintain her daily dose of advair as this will keep her out of the hospital  Follow-up: The patient will follow up in November.   Follow up for keep Nov and add 3 months after.   Risks, benefits, and alternatives of the medications and treatment plan prescribed today were discussed, and patient expressed understanding. Answered all questions and addressed all concerns to the patient's satisfaction.  Plan follow-up as discussed or as needed if any worsening symptoms or change in condition.  Patient voiced understanding of the treatment plan and agreed to attempt to comply.  Designer, fashion/clothing may have been used to create parts of the visit note. Consent from the patient/caregiver was obtained prior to its use.  See after visit summary  for patient specific instructions.    Subjective   Katelyn Winters is a 68 y.o. female who presents with:     Patient presents with  . Asthma     Pt presents to clinic for recheck since her last ED visit  3 ED visits since early August for asthma - 9/22 last went to ED -  Pt ran out of her advair in August - she did not have any more refills at the pharmacy and her f/u appt with me was rescheduled and she did not understand to ask for a refill through [facility] or her pharmacy.  The albuterol  she was given in the ED helps but she has to keep going back.  ED notes reviewed from last visits.  ---------------------------------------------------------------- Computer generated note History of Present Illness The patient presents for evaluation of asthma.  She has been experiencing frequent hospital visits due to her asthma, with 3 admissions in the past 2 months. Her most recent visit was on 05/23/2024, during which she was prescribed prednisone . She reports feeling better after completing a 5-day course of antibiotics. She has run out of her Advair medication, which she takes as 1 puff twice daily. She also uses a rescue inhaler every 4 hours as needed. She notes that her symptoms improve when she uses her Advair.  History was obtained from patient through interpretor Darlene - [phone].   Reviewed and updated this visit by provider: Tobacco  Allergies  Meds  Problems  Med Hx  Surg Hx  Fam  Hx        Review of Systems  Respiratory:  Positive for cough, shortness of breath and wheezing.         Objective   Vitals:   05/27/24 1638  BP: 122/74  Patient Position: Sitting  Pulse: 74  Temp: 97 F (36.1 C)  TempSrc: Temporal  Resp: 17  Height: 5' 6 (1.676 m)  Weight: 203 lb 3.2 oz (92.2 kg)  SpO2: 96%  BMI (Calculated): 32.8    Physical Exam Vitals and nursing note reviewed.  Constitutional:      Appearance: Normal appearance.  HENT:     Head: Normocephalic and  atraumatic.     Right Ear: External ear normal.     Left Ear: External ear normal.     Nose: Nose normal.  Eyes:     Conjunctiva/sclera: Conjunctivae normal.  Cardiovascular:     Rate and Rhythm: Normal rate and regular rhythm.     Pulses: Normal pulses.     Heart sounds: Normal heart sounds.  Musculoskeletal:     Cervical back: Normal range of motion.  Pulmonary:     Effort: Pulmonary effort is normal.     Breath sounds: Wheezing (end forced expiratory at bases) present.  Skin:    General: Skin is warm and dry.  Neurological:     General: No focal deficit present.     Mental Status: She is alert and oriented to person, place, and time. Mental status is at baseline.  Psychiatric:        Mood and Affect: Mood normal.        Behavior: Behavior normal.        Thought Content: Thought content normal.        Judgment: Judgment normal.        Lauraine Allen DEVONNA [facility]  05/27/2024

## 2024-06-09 ENCOUNTER — Emergency Department (HOSPITAL_COMMUNITY)
Admission: EM | Admit: 2024-06-09 | Discharge: 2024-06-10 | Disposition: A | Discharge status: Home / Self Care | Attending: Emergency Medicine | Admitting: Emergency Medicine

## 2024-06-09 ENCOUNTER — Emergency Department (HOSPITAL_COMMUNITY)

## 2024-06-09 ENCOUNTER — Other Ambulatory Visit: Payer: Self-pay

## 2024-06-09 DIAGNOSIS — E1165 Type 2 diabetes mellitus with hyperglycemia: Secondary | ICD-10-CM | POA: Insufficient documentation

## 2024-06-09 DIAGNOSIS — N950 Postmenopausal bleeding: Secondary | ICD-10-CM | POA: Diagnosis present

## 2024-06-09 DIAGNOSIS — Z79899 Other long term (current) drug therapy: Secondary | ICD-10-CM | POA: Insufficient documentation

## 2024-06-09 DIAGNOSIS — R739 Hyperglycemia, unspecified: Secondary | ICD-10-CM

## 2024-06-09 DIAGNOSIS — Z7984 Long term (current) use of oral hypoglycemic drugs: Secondary | ICD-10-CM | POA: Diagnosis not present

## 2024-06-09 DIAGNOSIS — I10 Essential (primary) hypertension: Secondary | ICD-10-CM | POA: Insufficient documentation

## 2024-06-09 DIAGNOSIS — D259 Leiomyoma of uterus, unspecified: Secondary | ICD-10-CM | POA: Insufficient documentation

## 2024-06-09 DIAGNOSIS — D539 Nutritional anemia, unspecified: Secondary | ICD-10-CM | POA: Insufficient documentation

## 2024-06-09 LAB — URINALYSIS, ROUTINE W REFLEX MICROSCOPIC
Bacteria, UA: NONE SEEN
Bilirubin Urine: NEGATIVE
Glucose, UA: NEGATIVE mg/dL
Ketones, ur: NEGATIVE mg/dL
Nitrite: NEGATIVE
Protein, ur: NEGATIVE mg/dL
Specific Gravity, Urine: 1.019 (ref 1.005–1.030)
pH: 6 (ref 5.0–8.0)

## 2024-06-09 LAB — COMPREHENSIVE METABOLIC PANEL WITH GFR
ALT: 20 U/L (ref 0–44)
AST: 29 U/L (ref 15–41)
Albumin: 3.4 g/dL — ABNORMAL LOW (ref 3.5–5.0)
Alkaline Phosphatase: 67 U/L (ref 38–126)
Anion gap: 12 (ref 5–15)
BUN: 14 mg/dL (ref 8–23)
CO2: 25 mmol/L (ref 22–32)
Calcium: 9.3 mg/dL (ref 8.9–10.3)
Chloride: 103 mmol/L (ref 98–111)
Creatinine, Ser: 1.01 mg/dL — ABNORMAL HIGH (ref 0.44–1.00)
GFR, Estimated: >60 mL/min (ref >60)
Glucose, Bld: 119 mg/dL — ABNORMAL HIGH (ref 70–99)
Potassium: 4 mmol/L (ref 3.5–5.1)
Sodium: 140 mmol/L (ref 135–145)
Total Bilirubin: 0.5 mg/dL (ref 0.0–1.2)
Total Protein: 6.6 g/dL (ref 6.5–8.1)

## 2024-06-09 LAB — CBC WITH DIFFERENTIAL/PLATELET
Abs Immature Granulocytes: 0.02 K/uL (ref 0.00–0.07)
Basophils Absolute: 0 K/uL (ref 0.0–0.1)
Basophils Relative: 0 %
Eosinophils Absolute: 0.2 K/uL (ref 0.0–0.5)
Eosinophils Relative: 4 %
HCT: 35.3 % — ABNORMAL LOW (ref 36.0–46.0)
Hemoglobin: 11.6 g/dL — ABNORMAL LOW (ref 12.0–15.0)
Immature Granulocytes: 0 %
Lymphocytes Relative: 23 %
Lymphs Abs: 1.3 K/uL (ref 0.7–4.0)
MCH: 33.1 pg (ref 26.0–34.0)
MCHC: 32.9 g/dL (ref 30.0–36.0)
MCV: 100.9 fL — ABNORMAL HIGH (ref 80.0–100.0)
Monocytes Absolute: 0.4 K/uL (ref 0.1–1.0)
Monocytes Relative: 7 %
Neutro Abs: 3.7 K/uL (ref 1.7–7.7)
Neutrophils Relative %: 66 %
Platelets: 262 K/uL (ref 150–400)
RBC: 3.5 MIL/uL — ABNORMAL LOW (ref 3.87–5.11)
RDW: 12.8 % (ref 11.5–15.5)
WBC: 5.6 K/uL (ref 4.0–10.5)
nRBC: 0 % (ref 0.0–0.2)

## 2024-06-09 LAB — PROTIME-INR
INR: 1 (ref 0.8–1.2)
Prothrombin Time: 14.1 s (ref 11.4–15.2)

## 2024-06-09 LAB — SAMPLE TO BLOOD BANK

## 2024-06-09 NOTE — ED Provider Triage Note (Signed)
 Emergency Medicine Provider Triage Evaluation Note  Emilly Lavey , a 68 y.o. female  was evaluated in triage.  Pt complains of vaginal bleeding.  Not heavy bleeding.  No chest pain, shortness of breath, or lightheadedness.  She states she is postmenopausal and she started bleeding about 1 month ago.  Review of Systems  Positive: As above Negative: As above  Physical Exam  BP 123/66   Pulse 84   Temp 98.1 F (36.7 C)   Resp 18   SpO2 93%  Gen:   Awake, no distress   Resp:  Normal effort  MSK:   Moves extremities without difficulty  Other:    Medical Decision Making  Medically screening exam initiated at 8:23 PM.  Appropriate orders placed.  Gracelynn Bircher was informed that the remainder of the evaluation will be completed by another provider, this initial triage assessment does not replace that evaluation, and the importance of remaining in the ED until their evaluation is complete.    Hildegard Loge, PA-C 06/09/24 2023

## 2024-06-09 NOTE — ED Triage Notes (Signed)
 Patient reports persistent vaginal bleeding for 1 1/2 months with intermittent low abdominal pain and lightheadedness . No emesis or diarrhea . No anticoagulant medication .

## 2024-06-10 ENCOUNTER — Emergency Department (HOSPITAL_COMMUNITY)

## 2024-06-10 MED ORDER — IOHEXOL 350 MG/ML SOLN
75.0000 mL | Freq: Once | INTRAVENOUS | Status: AC | PRN
  Administered 2024-06-10: 75 mL via INTRAVENOUS

## 2024-06-10 NOTE — Discharge Instructions (Signed)
 Your CT scan shows that you have a thickened lining of the uterus.  This needs to be evaluated by a gynecologist.  Please call today for an appointment.  Hopefully, you will be able to be seen in the next week.  However, at any point if you have concerns, you are welcome to return to the emergency department.

## 2024-06-10 NOTE — ED Provider Notes (Signed)
 [place] EMERGENCY DEPARTMENT AT [facility] Provider Note   CSN: [id number] Arrival date & time: 06/09/24  1931     Patient presents with: Vaginal Bleeding    Katelyn Winters is a 68 y.o. female.   The history is provided by the patient. A language interpreter was used [place] [phone]).   She has history of hypertension, diabetes, deafness and comes in because of postmenopausal bleeding.  She told me that she has been bleeding for the last 10 days, told PA at triage that she had been bleeding for the last month, told triage nurse that she had been bleeding for 1.5 months.  She states it is mainly when she urinates but she does have some bleeding in between times of urinating, has not been using pads.  She denies passing any clots.  She has had some intermittent crampy right suprapubic pain.  Bleeding got worse today which is what prompted the ED visit.  She has not tried to see her primary care physician, does not have a gynecologist.  She is not on any anticoagulants.    Prior to Admission medications   Medication Sig Start Date End Date Taking? Authorizing Provider  albuterol  (PROVENTIL ) (2.5 MG/3ML) 0.083% nebulizer solution Take 3 mLs (2.5 mg total) by nebulization every 6 (six) hours as needed for wheezing or shortness of breath. 12/01/23   Hunsucker, Donnice SAUNDERS, MD  albuterol  (VENTOLIN  HFA) 108 (90 Base) MCG/ACT inhaler Inhale 2 puffs into the lungs every 6 (six) hours as needed for wheezing or shortness of breath. 12/01/23   Hunsucker, Donnice SAUNDERS, MD  benzonatate  (TESSALON ) 100 MG capsule Take 2 capsules (200 mg total) by mouth 3 (three) times daily as needed for cough. 08/22/21   Armenta Canning, MD  Fluticasone -Umeclidin-Vilant (TRELEGY ELLIPTA ) 200-62.5-25 MCG/ACT AEPB Inhale 1 puff into the lungs daily. 12/01/23   Hunsucker, Donnice SAUNDERS, MD  gabapentin (NEURONTIN) 300 MG capsule Take 300 mg by mouth 3 (three) times daily. 08/12/21   [provider]  lisinopril  (ZESTRIL) 5 MG tablet Take 5 mg by mouth daily. 06/28/21   [provider]  metFORMIN  (GLUCOPHAGE ) 500 MG tablet Take 1 tablet (500 mg total) by mouth 2 (two) times daily with a meal. 01/11/18   Towana Ozell BROCKS, MD  rosuvastatin (CRESTOR) 20 MG tablet Take 20 mg by mouth daily. 07/18/21   [provider]  dicyclomine  (BENTYL ) 20 MG tablet Take 1 tablet (20 mg total) by mouth 2 (two) times daily as needed (for abdominal cramping). Patient not taking: Reported on 12/23/2017 12/14/15 07/14/19  Keith Sor, PA-C    Allergies: Patient has no known allergies.    Review of Systems  All other systems reviewed and are negative.   Updated Vital Signs BP 122/64 (BP Location: Right Arm)   Pulse 72   Temp 97.7 F (36.5 C)   Resp 16   SpO2 98%   Physical Exam Vitals and nursing note reviewed.   68 year old female, resting comfortably and in no acute distress. Vital signs are normal. Oxygen saturation is 98%, which is normal. Head is normocephalic and atraumatic. PERRLA, EOMI.  Lungs are clear without rales, wheezes, or rhonchi. Heart has regular rate and rhythm without murmur. Abdomen is soft, flat, with mild tenderness in the right suprapubic area.  There is no rebound or guarding. Pelvic: Refused. Skin is warm and dry without rash. Neurologic: Awake and alert, moves all extremities equally.  (all labs ordered are listed, but only abnormal results are  displayed) Labs Reviewed  CBC WITH DIFFERENTIAL/PLATELET - Abnormal; Notable for the following components:      Result Value   RBC 3.50 (*)    Hemoglobin 11.6 (*)    HCT 35.3 (*)    MCV 100.9 (*)    All other components within normal limits  COMPREHENSIVE METABOLIC PANEL WITH GFR - Abnormal; Notable for the following components:   Glucose, Bld 119 (*)    Creatinine, Ser 1.01 (*)    Albumin 3.4 (*)    All other components within normal limits  URINALYSIS, ROUTINE W REFLEX MICROSCOPIC - Abnormal; Notable for the  following components:   APPearance HAZY (*)    Hgb urine dipstick SMALL (*)    Leukocytes,Ua MODERATE (*)    All other components within normal limits  PROTIME-INR  SAMPLE TO BLOOD BANK    EKG: None  Radiology: CT ABDOMEN PELVIS W CONTRAST Result Date: 06/10/2024 EXAM: CT ABDOMEN AND PELVIS WITH CONTRAST 06/10/2024 04:54:41 AM TECHNIQUE: CT of the abdomen and pelvis was performed with the administration of 75 mL of iohexol  (OMNIPAQUE ) 350 MG/ML injection. Multiplanar reformatted images are provided for review. Automated exposure control, iterative reconstruction, and/or weight-based adjustment of the mA/kV was utilized to reduce the radiation dose to as low as reasonably achievable. COMPARISON: 07/13/2019 CLINICAL HISTORY: RLQ abdominal pain. Patient reports persistent vaginal bleeding for 1 1/2 months with intermittent low abdominal pain and lightheadedness. No emesis or diarrhea. No anticoagulant medication. FINDINGS: LOWER CHEST: No acute abnormality. LIVER: The liver is unremarkable. GALLBLADDER AND BILE DUCTS: Status post cholecystectomy. 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 SPLEEN: No acute abnormality. PANCREAS: No acute abnormality. ADRENAL GLANDS: No acute abnormality. KIDNEYS, URETERS AND BLADDER: A Bosniak class I cyst is identified within the upper pole of the right kidney, measuring 2.1 cm, image 28/3. Per consensus, no follow-up is needed for simple Bosniak type 1 and 2 renal cysts, unless the patient has a malignancy history or risk factors. No stones in the kidneys or ureters. No hydronephrosis. No perinephric or periureteral stranding. Urinary bladder is unremarkable. GI AND BOWEL: Stomach demonstrates no acute abnormality. The appendix is normal, image 44/3. Sigmoid diverticulosis without acute diverticulitis. Mild-to-moderate colonic stool burden. There is no bowel obstruction. PERITONEUM AND RETROPERITONEUM: No ascites.  No free air. VASCULATURE: Aorta is normal in caliber. Aortic atherosclerotic calcification. Increased paramedial vascularity is identified bilaterally. LYMPH NODES: No lymphadenopathy. REPRODUCTIVE ORGANS: Anterior submucosal fibroid measures 2.2 cm, image 82/8. The endometrium measures 1.2 cm, image 80/8. This is considered abnormal in a postmenopausal female. No adnexal mass. BONES AND SOFT TISSUES: Unchanged lumbar spondylosis. Superior endplate Schmorl's node identified at the L3 vertebra. No acute osseous abnormality. No focal soft tissue abnormality. IMPRESSION: 1. No acute findings in the abdomen or pelvis. 2. Abnormal endometrial thickness of 1.2 cm in a postmenopausal female. A transabdominal pelvic sonogram was performed on the prior day, which was not diagnostic for the evaluation of the endometrium. If the patient is unable to undergo transvaginal pelvic ultrasound then follow up with prompt, non-emergent contrast-enhanced pelvic MRI would be recommended. Electronically signed by: Waddell Calk MD 06/10/2024 05:36 AM EDT RP Workstation: [id number]   US  Pelvis Complete Result Date: 06/09/2024 CLINICAL DATA:  vaginal bleeding EXAM: TRANSABDOMINAL ULTRASOUND OF PELVIS TECHNIQUE: Transabdominal ultrasound examination of the pelvis was performed including evaluation of the uterus, ovaries, adnexal regions, and pelvic cul-de-sac. COMPARISON:  CT abdomen pelvis 07/12/2025 FINDINGS: Uterus Measurements: 7.9 x 3.8 x 4.9  cm = volume: 78 mL. No fibroids or other mass visualized. Endometrium Not visualized on transabdominal only ultrasound. Right ovary Not visualized. Left ovary Not visualized. Other findings:  No abnormal free fluid. IMPRESSION: Transabdominal only ultrasound. Markedly limited evaluation with grossly unremarkable uterus-endometrium not visualized. Bilateral ovaries not visualized. Electronically Signed   By: Morgane  Naveau M.D.   On: 06/09/2024 22:25     Procedures   Medications Ordered  in the ED  iohexol  (OMNIPAQUE ) 350 MG/ML injection 75 mL (75 mLs Intravenous Contrast Given 06/10/24 0459)                                    Medical Decision Making Amount and/or Complexity of Data Reviewed Labs: ordered. Radiology: ordered.  Risk Prescription drug management.   Postmenopausal bleeding.  This a presentation with a wide range of treatment options and carries with it a high risk of morbidity and complications.  Differential diagnosis includes, but is not limited to, endometrial tumor, cervical cancer, infection.  Unfortunately, patient is refusing pelvic exam stating that she is a virgin and has does not want a man to touch her.  I have reviewed her laboratory tests, and my interpretation is microscopic hematuria, anemia not significantly changed from most recent value, borderline elevated serum creatinine which is not felt to be clinically significant, normal INR.  Pelvic ultrasound ordered at triage was suboptimal, she refused transvaginal ultrasound, grossly unremarkable uterus with endometrium not visualized and ovaries not visualized.  Have independently viewed the images, and agree with the radiologist's interpretation.  I am ordering a CT of abdomen and pelvis and plan to refer her to women's outpatient clinic for further evaluation.  CT scan shows thickened endometrium, incidental finding of a small submucosal fibroid.  I have given patient referral to the women's outpatient clinic for further workup.     Final diagnoses:  Postmenopausal bleeding  Elevated random blood glucose level  Macrocytic anemia  Uterine leiomyoma, unspecified location    ED Discharge Orders     None          Raford Lenis, MD 06/10/24 [phone]

## 2024-06-13 ENCOUNTER — Other Ambulatory Visit (HOSPITAL_COMMUNITY)
Admission: RE | Admit: 2024-06-13 | Discharge: 2024-06-13 | Disposition: A | Discharge status: Home / Self Care | Source: Ambulatory Visit | Attending: Obstetrics and Gynecology | Admitting: Obstetrics and Gynecology

## 2024-06-13 ENCOUNTER — Ambulatory Visit: Admitting: Obstetrics and Gynecology

## 2024-06-13 ENCOUNTER — Encounter: Payer: Self-pay | Admitting: Obstetrics and Gynecology

## 2024-06-13 ENCOUNTER — Other Ambulatory Visit (HOSPITAL_COMMUNITY)
Admission: RE | Admit: 2024-06-13 | Discharge: 2024-06-13 | Disposition: A | Source: Ambulatory Visit | Attending: Obstetrics and Gynecology | Admitting: Obstetrics and Gynecology

## 2024-06-13 VITALS — BP 135/83 | HR 75 | Wt 202.0 lb

## 2024-06-13 DIAGNOSIS — Z1151 Encounter for screening for human papillomavirus (HPV): Secondary | ICD-10-CM | POA: Insufficient documentation

## 2024-06-13 DIAGNOSIS — N950 Postmenopausal bleeding: Secondary | ICD-10-CM | POA: Insufficient documentation

## 2024-06-13 DIAGNOSIS — C541 Malignant neoplasm of endometrium: Secondary | ICD-10-CM | POA: Diagnosis not present

## 2024-06-13 DIAGNOSIS — Z01411 Encounter for gynecological examination (general) (routine) with abnormal findings: Secondary | ICD-10-CM | POA: Diagnosis present

## 2024-06-13 NOTE — Progress Notes (Signed)
 GYNECOLOGY OFFICE VISIT NOTE  History:  Katelyn Winters is a 68 y.o. G0P0000 here today for PMB. Started October 1st.    No other associated symptoms.   She had a TA US  in the hospital and the EL could not be visualized by the report.  I reviewed the images myself and agree, there is poor visualization of the uterus and EL.   She did have a CT which showed Anterior submucosal fibroid measures 2.2 cm, image 82/8. The endometrium measures 1.2 cm, image 80/8. This is considered abnormal in a postmenopausal female. No adnexal mass.    Past Medical History:  Diagnosis Date   Deaf    Diabetes mellitus without complication (HCC)    Hypertension     Past Surgical History:  Procedure Laterality Date   BLADDER SURGERY      The following portions of the patient's history were reviewed and updated as appropriate: allergies, current medications, past family history, past medical history, past social history, past surgical history and problem list.   Review of Systems:  Pertinent items noted in HPI and remainder of comprehensive ROS otherwise negative.  Physical Exam:  BP 135/83   Pulse 75   Wt 202 lb (91.6 kg)   BMI 32.60 kg/m  CONSTITUTIONAL: Well-developed, well-nourished female in no acute distress.  HEENT:  Normocephalic, atraumatic. External right and left ear normal. No scleral icterus.  NECK: Normal range of motion, supple, no masses noted on observation SKIN: No rash noted. Not diaphoretic. No erythema. No pallor. MUSCULOSKELETAL: Normal range of motion. No edema noted. NEUROLOGIC: Alert and oriented to person, place, and time. Normal muscle tone coordination. No cranial nerve deficit noted. PSYCHIATRIC: Normal mood and affect. Normal behavior. Normal judgment and thought content.  PELVIC: Normal appearing external genitalia; normal urethral meatus; normal appearing vaginal mucosa and cervix.  No abnormal discharge noted.  Normal uterine size, no other palpable masses, no  uterine or adnexal tenderness. Performed in the presence of a chaperone  Labs and Imaging Results for orders placed or performed during the hospital encounter of 06/09/24 (from the past week)  Sample to Blood Bank   Collection Time: 06/09/24  8:00 PM  Result Value Ref Range   Blood Bank Specimen SAMPLE AVAILABLE FOR TESTING    Sample Expiration      06/12/2024,2359 Performed at [facility], [address] [place], [place] [address]   CBC with Differential   Collection Time: 06/09/24  8:12 PM  Result Value Ref Range   WBC 5.6 4.0 - 10.5 K/uL   RBC 3.50 (L) 3.87 - 5.11 MIL/uL   Hemoglobin 11.6 (L) 12.0 - 15.0 g/dL   HCT 64.6 (L) 63.9 - 53.9 %   MCV 100.9 (H) 80.0 - 100.0 fL   MCH 33.1 26.0 - 34.0 pg   MCHC 32.9 30.0 - 36.0 g/dL   RDW 87.1 88.4 - 84.4 %   Platelets 262 150 - 400 K/uL   nRBC 0.0 0.0 - 0.2 %   Neutrophils Relative % 66 %   Neutro Abs 3.7 1.7 - 7.7 K/uL   Lymphocytes Relative 23 %   Lymphs Abs 1.3 0.7 - 4.0 K/uL   Monocytes Relative 7 %   Monocytes Absolute 0.4 0.1 - 1.0 K/uL   Eosinophils Relative 4 %   Eosinophils Absolute 0.2 0.0 - 0.5 K/uL   Basophils Relative 0 %   Basophils Absolute 0.0 0.0 - 0.1 K/uL   Immature Granulocytes 0 %   Abs Immature Granulocytes  0.02 0.00 - 0.07 K/uL  Comprehensive metabolic panel   Collection Time: 06/09/24  8:12 PM  Result Value Ref Range   Sodium 140 135 - 145 mmol/L   Potassium 4.0 3.5 - 5.1 mmol/L   Chloride 103 98 - 111 mmol/L   CO2 25 22 - 32 mmol/L   Glucose, Bld 119 (H) 70 - 99 mg/dL   BUN 14 8 - 23 mg/dL   Creatinine, Ser 8.98 (H) 0.44 - 1.00 mg/dL   Calcium 9.3 8.9 - 89.6 mg/dL   Total Protein 6.6 6.5 - 8.1 g/dL   Albumin 3.4 (L) 3.5 - 5.0 g/dL   AST 29 15 - 41 U/L   ALT 20 0 - 44 U/L   Alkaline Phosphatase 67 38 - 126 U/L   Total Bilirubin 0.5 0.0 - 1.2 mg/dL   GFR, Estimated >39 >39 mL/min   Anion gap 12 5 - 15  Protime-INR   Collection Time: 06/09/24  8:12 PM  Result Value Ref Range    Prothrombin Time 14.1 11.4 - 15.2 seconds   INR 1.0 0.8 - 1.2  Urinalysis, Routine w reflex microscopic -Urine, Clean Catch   Collection Time: 06/09/24  8:30 PM  Result Value Ref Range   Color, Urine YELLOW YELLOW   APPearance HAZY (A) CLEAR   Specific Gravity, Urine 1.019 1.005 - 1.030   pH 6.0 5.0 - 8.0   Glucose, UA NEGATIVE NEGATIVE mg/dL   Hgb urine dipstick SMALL (A) NEGATIVE   Bilirubin Urine NEGATIVE NEGATIVE   Ketones, ur NEGATIVE NEGATIVE mg/dL   Protein, ur NEGATIVE NEGATIVE mg/dL   Nitrite NEGATIVE NEGATIVE   Leukocytes,Ua MODERATE (A) NEGATIVE   RBC / HPF 11-20 0 - 5 RBC/hpf   WBC, UA 11-20 0 - 5 WBC/hpf   Bacteria, UA NONE SEEN NONE SEEN   Squamous Epithelial / HPF 0-5 0 - 5 /HPF   Mucus PRESENT    CT ABDOMEN PELVIS W CONTRAST Result Date: 06/10/2024 EXAM: CT ABDOMEN AND PELVIS WITH CONTRAST 06/10/2024 04:54:41 AM TECHNIQUE: CT of the abdomen and pelvis was performed with the administration of 75 mL of iohexol  (OMNIPAQUE ) 350 MG/ML injection. Multiplanar reformatted images are provided for review. Automated exposure control, iterative reconstruction, and/or weight-based adjustment of the mA/kV was utilized to reduce the radiation dose to as low as reasonably achievable. COMPARISON: 07/13/2019 CLINICAL HISTORY: RLQ abdominal pain. Patient reports persistent vaginal bleeding for 1 1/2 months with intermittent low abdominal pain and lightheadedness. No emesis or diarrhea. No anticoagulant medication. FINDINGS: LOWER CHEST: No acute abnormality. LIVER: The liver is unremarkable. GALLBLADDER AND BILE DUCTS: Status post cholecystectomy. 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 SPLEEN: No acute abnormality. PANCREAS: No acute abnormality. ADRENAL GLANDS: No acute abnormality. KIDNEYS, URETERS AND BLADDER: A Bosniak class I cyst is identified within the upper pole of the right kidney, measuring 2.1 cm, image 28/3. Per consensus,  no follow-up is needed for simple Bosniak type 1 and 2 renal cysts, unless the patient has a malignancy history or risk factors. No stones in the kidneys or ureters. No hydronephrosis. No perinephric or periureteral stranding. Urinary bladder is unremarkable. GI AND BOWEL: Stomach demonstrates no acute abnormality. The appendix is normal, image 44/3. Sigmoid diverticulosis without acute diverticulitis. Mild-to-moderate colonic stool burden. There is no bowel obstruction. PERITONEUM AND RETROPERITONEUM: No ascites. No free air. VASCULATURE: Aorta is normal in caliber. Aortic atherosclerotic calcification. Increased paramedial vascularity is identified bilaterally. LYMPH NODES: No lymphadenopathy. REPRODUCTIVE  ORGANS: Anterior submucosal fibroid measures 2.2 cm, image 82/8. The endometrium measures 1.2 cm, image 80/8. This is considered abnormal in a postmenopausal female. No adnexal mass. BONES AND SOFT TISSUES: Unchanged lumbar spondylosis. Superior endplate Schmorl's node identified at the L3 vertebra. No acute osseous abnormality. No focal soft tissue abnormality. IMPRESSION: 1. No acute findings in the abdomen or pelvis. 2. Abnormal endometrial thickness of 1.2 cm in a postmenopausal female. A transabdominal pelvic sonogram was performed on the prior day, which was not diagnostic for the evaluation of the endometrium. If the patient is unable to undergo transvaginal pelvic ultrasound then follow up with prompt, non-emergent contrast-enhanced pelvic MRI would be recommended. Electronically signed by: Waddell Calk MD 06/10/2024 05:36 AM EDT RP Workstation: [id number]   US  Pelvis Complete Result Date: 06/09/2024 CLINICAL DATA:  vaginal bleeding EXAM: TRANSABDOMINAL ULTRASOUND OF PELVIS TECHNIQUE: Transabdominal ultrasound examination of the pelvis was performed including evaluation of the uterus, ovaries, adnexal regions, and pelvic cul-de-sac. COMPARISON:  CT abdomen pelvis 07/12/2025 FINDINGS: Uterus  Measurements: 7.9 x 3.8 x 4.9 cm = volume: 78 mL. No fibroids or other mass visualized. Endometrium Not visualized on transabdominal only ultrasound. Right ovary Not visualized. Left ovary Not visualized. Other findings:  No abnormal free fluid. IMPRESSION: Transabdominal only ultrasound. Markedly limited evaluation with grossly unremarkable uterus-endometrium not visualized. Bilateral ovaries not visualized. Electronically Signed   By: Morgane  Naveau M.D.   On: 06/09/2024 22:25      GYNECOLOGY OFFICE PROCEDURE NOTE   ENDOMETRIAL BIOPSY     The indications for endometrial biopsy were reviewed.   Risks of the biopsy including cramping, bleeding, infection, uterine perforation, inadequate specimen and need for additional procedures were discussed. Offered alternative of hysteroscopy, dilation and curettage in OR. The patient states she understands the R/B/I/A and agrees to undergo procedure today. Urine pregnancy test was Not indicated. Consent was signed. Time out was performed.    Patient was positioned in dorsal lithotomy position. A vaginal speculum was placed.  The cervix was visualized and was prepped with Betadine.  A single-toothed tenaculum was placed on the anterior lip of the cervix to stabilize it. The 3 mm pipelle was easily introduced into the endometrial cavity without difficulty to a depth of 8 cm, and a Moderate amount of tissue was obtained after two passes and sent to pathology. The instruments were removed from the patient's vagina. Minimal bleeding from the cervix was noted. The patient tolerated the procedure well.   Assessment and Plan:  1. PMB (postmenopausal bleeding) (Primary) EMB and pap with HPV done today. Patient tolerated it very well.  Will notify once we have results.  Sign Interpreter used throughout. - Surgical pathology - Cytology - PAP   No orders of the defined types were placed in this encounter.    Routine preventative health maintenance measures  emphasized. Please refer to After Visit Summary for other counseling recommendations.   No follow-ups on file.  Vina Solian, MD, FACOG Obstetrician & Gynecologist, [facility] for [facility], [facility]

## 2024-06-15 NOTE — Progress Notes (Addendum)
 Spoke with the Patient today. Red Rule verified YES. The purpose of the call was to review and discuss upcoming appointments and health maintenance screenings due within the next year.  NA   Health Maintenance  Topic Date Due  . COPD Spirometry Testing  Never done  . Zoster Vaccine (1 of 2) Never done  . RSV Adult and Pregnancy (1 - Risk 50-74 years 1-dose series) Never done  . Diabetes Eye Exam  07/02/2019  . DEXA Scan  Never done  . Medicare Annual Wellness  03/19/2022  . Diabetes Annual Microalbumin/Creatinine Ratio  07/18/2023  . Diabetes Foot Exam  07/18/2023  . Diabetes Lipid Profile  10/18/2023  . Influenza Vaccine (1) 05/02/2024  . Mammogram  08/19/2024  . Diabetes Hemoglobin A1C  10/25/2024  . Diabetes GFR/EGFR  02/10/2025  . Lung Cancer Screening  02/26/2025  . Colorectal Cancer Screening  03/22/2025  . DTaP/Tdap/Td Vaccines (4 - Td or Tdap) 03/20/2031  . Pneumococcal Vaccine: 50+ Years  Completed  . Meningococcal B Vaccine  Aged Out  . Hepatitis A Vaccine  Aged Out  . Meningococcal Conjugate Vaccine  Aged Out      Appointments which have been scheduled    Jul 22, 2024 10:30 AM Office Visit with Lauraine Patient, PA [facility]) [address] [place] [address] [phone]   Aug 23, 2024 1:00 PM MAMMO 3D TOMO SCREEN BIL with IBCGR MAMMO RM 1 [facility] (Mammo) [facility] [place]) [address] [place] [address] [phone]       Health Maintenance Reviewed: ANNUAL EYE EXAMS  Appointments Scheduled: NA  Referrals Placed During This Encounter: Referral to Ophthalmology (Diabetic Eye Exam)  Comments: N/A  Scheduled outside appointment for eye exam  on 07/14/2024 @12 :13pm at [facility] Coordination Note: This chart has been reviewed by a [facility] Specialist to  identify and address outstanding health maintenance needs, with the goal of closing quality care gaps and supporting preventive care efforts.

## 2024-06-15 NOTE — Progress Notes (Signed)
 Attempted to contact the patient to discuss upcoming appointments and recommended health maintenance screenings for the year.  A [facility] Specialist has reviewed the patient's chart to identify any gaps in care and outstanding preventive health needs. However, we were unable to reach the patient to review or schedule the necessary follow-up appointments and screenings.  NA  Left message: yes   MyChart message sent: yes   Voicemail was left including callback details and our hours of operation.  Comments:

## 2024-06-17 ENCOUNTER — Telehealth: Payer: Self-pay | Admitting: Obstetrics and Gynecology

## 2024-06-17 ENCOUNTER — Ambulatory Visit: Payer: Self-pay | Admitting: Obstetrics and Gynecology

## 2024-06-17 DIAGNOSIS — Z1509 Genetic susceptibility to other malignant neoplasm: Secondary | ICD-10-CM | POA: Insufficient documentation

## 2024-06-17 DIAGNOSIS — C541 Malignant neoplasm of endometrium: Secondary | ICD-10-CM | POA: Insufficient documentation

## 2024-06-17 LAB — CYTOLOGY - PAP
Comment: NEGATIVE
Diagnosis: NEGATIVE
Diagnosis: REACTIVE
High risk HPV: NEGATIVE

## 2024-06-17 LAB — SURGICAL PATHOLOGY

## 2024-06-17 NOTE — Telephone Encounter (Signed)
 Called Katelyn Winters and informed her of results of both the endometrial cancer and Lynch Syndrome. Mychart messages also sent. Gyn Onc office notified that patient is aware and they will make her appt.   Vina Solian, MD Attending Obstetrician & Gynecologist, [facility] for [facility], [facility]

## 2024-06-20 ENCOUNTER — Telehealth: Payer: Self-pay | Admitting: *Deleted

## 2024-06-20 NOTE — Telephone Encounter (Signed)
 Dr Elfredia office called and scheduled the patient's appt; patient was in their office. Dr Elfredia office will give the patient the appt date/time and the address of the clinic.

## 2024-06-23 NOTE — Progress Notes (Unsigned)
 GYNECOLOGIC ONCOLOGY NEW PATIENT CONSULTATION   Patient Name: Katelyn Winters  Patient Age: 68 y.o. 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  We reviewed the nature of endometrial cancer and its recommended surgical staging, including total hysterectomy, bilateral salpingo-oophorectomy, and lymph node assessment. The patient is a suitable candidate for staging via a minimally invasive approach to surgery.  We reviewed that robotic assistance would be used to complete the surgery.   We discussed that most endometrial cancer is detected early and that decisions regarding adjuvant therapy will be made based on her final pathology.   CT scan at time of recent ED visit showed no evidence of metastatic disease.  We reviewed the sentinel lymph node technique. Risks and benefits of sentinel lymph node biopsy was reviewed. We reviewed the technique and ICG dye. The patient DOES NOT have an iodine allergy or known liver dysfunction. We reviewed the false negative rate (0.4%), and that 3% of patients with metastatic disease will not have it detected by SLN biopsy in endometrial cancer. A low risk of allergic reaction to the dye, <0.2% for ICG, has been reported. We also discussed that in the case of failed mapping, which occurs 40% of the time, a bilateral or unilateral lymphadenectomy will be performed at the surgeon's discretion.   Potential benefits of sentinel nodes including a higher detection rate for metastasis due to ultrastaging and potential reduction in operative morbidity. However, there remains uncertainty as to the role for treatment of micrometastatic disease. Further, the benefit of operative morbidity associated with the SLN technique in endometrial cancer is not yet  completely known. In other patient populations (e.g. the cervical cancer population) there has been observed reductions in morbidity with SLN biopsy compared to pelvic lymphadenectomy. Lymphedema, nerve dysfunction and lymphocysts are all potential risks with the SLN technique as with complete lymphadenectomy. Additional risks to the patient include the risk of damage to an internal organ while operating in an altered view (e.g. the black and white image of the robotic fluorescence imaging mode).   We discussed the plan for a robotic assisted hysterectomy, bilateral salpingo-oophorectomy, sentinel lymph node evaluation, possible lymph node dissection, possible laparotomy. The risks of surgery were discussed in detail and she understands these to include infection; wound separation; hernia; vaginal cuff separation, injury to adjacent organs such as bowel, bladder, blood vessels, ureters and nerves; bleeding which may require blood transfusion; anesthesia risk; thromboembolic events; possible death; unforeseen complications; possible need for re-exploration; medical complications such as heart attack, stroke, pleural effusion and pneumonia; and, if full lymphadenectomy is performed the risk of lymphedema and lymphocyst. The patient will receive DVT and antibiotic prophylaxis as indicated. She voiced a clear understanding. She had the opportunity to ask questions. Perioperative instructions were reviewed with her. Prescriptions for post-op medications were sent to her pharmacy of choice.  Reviewed IHC on her biopsy which shows loss of expression of PMS2.  This is highly suggestive of Lynch syndrome.  Discussed this with the patient and recommendation for referral to one of our genetic counselors for genetic testing.  Reviewed that if she has Lynch syndrome, this increases her risk of multiple other cancers, most notably colon cancer.  Because she has not had a colonoscopy in some time, patient was amenable to  having me place a referral for GI.  Also  discussed surgery that she had when she was a baby, reported to be some bladder surgery.  Given remoteness of this surgery, very unlikely that any records are available.  Discussed that there may be adhesions related to this prior surgery.  A copy of this note was sent to the patient's referring provider.   70 minutes of total time was spent for this patient encounter, including preparation, face-to-face counseling with the patient and coordination of care, and documentation of the encounter.  Comer Dollar, MD  Division of Gynecologic Oncology  Department of Obstetrics and Gynecology  [facility]  ___________________________________________  Chief Complaint: No chief complaint on file.   History of Present Illness:  Katelyn Winters is a 68 y.o. y.o. female who is seen in consultation at the request of Dr. Cleatus for an evaluation of endometrial cancer.  Present to ED on 10/10 with at least 10 days of postmenopausal bleeding, associate intermittent cramping.  Transabdominal ultrasound showed unremarkable uterus although endometrium not visualized. Ovaries not visualized.  CT A/P with thickened endometrium (1.2 cm). No adenopathy or other findings concerning for metastatic disease.  Saw Dr. Cleatus on 10/13 and underwent EMB. 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  Patient presents today by herself.  Her niece joins the visit by phone.  She reports overall doing well.  Bleeding started September 1.  She endorses bleeding most days although not enough to need a pad.  She has blood only when she wipes after voiding.  She has had intermittent right lower quadrant pain.  She endorses a good appetite, denies any nausea or emesis other than 1 episode of vomiting after eating.  Endorses normal bowel function.  Describes urinating more slowly but does not feel like she is retaining  urine.  The patient's niece tells me that many of the women on her mother's side of the family including the patient's maternal grandmother, mother, aunts, and cousins have breast cancer.  PAST MEDICAL HISTORY:  Past Medical History:  Diagnosis Date   Asthma    Deaf    Diabetes mellitus without complication (HCC)    Hypertension      PAST SURGICAL HISTORY:  Past Surgical History:  Procedure Laterality Date   BLADDER SURGERY      OB/GYN HISTORY:  OB History  Gravida Para Term Preterm AB Living  0 0 0 0 0 0  SAB IAB Ectopic Multiple Live Births  0 0 0 0 0    No LMP recorded. Patient is postmenopausal.  Age at menarche: 52  Age at menopause: 1 Hx of HRT: denies Hx of STDs: denies Last pap: unsure History of abnormal pap smears: denies  SCREENING STUDIES:  Last mammogram: 2024 per patient  Last colonoscopy: unsure, thinks about 10 years, maybe more  MEDICATIONS: Outpatient Encounter Medications as of 06/24/2024  Medication Sig   albuterol  (PROVENTIL ) (2.5 MG/3ML) 0.083% nebulizer solution Take 3 mLs (2.5 mg total) by nebulization every 6 (six) hours as needed for wheezing or shortness of breath.   albuterol  (VENTOLIN  HFA) 108 (90 Base) MCG/ACT inhaler Inhale 2 puffs into the lungs every 6 (six) hours as needed for wheezing or shortness of breath.   benzonatate  (TESSALON ) 100 MG capsule Take 2 capsules (200 mg total) by mouth 3 (three) times daily as needed for cough. (Patient not taking: Reported on 06/13/2024)   Fluticasone -Umeclidin-Vilant (TRELEGY ELLIPTA ) 200-62.5-25 MCG/ACT AEPB Inhale 1 puff into the lungs daily.   gabapentin (NEURONTIN) 300  MG capsule Take 300 mg by mouth 3 (three) times daily.   lisinopril (ZESTRIL) 5 MG tablet Take 5 mg by mouth daily.   metFORMIN  (GLUCOPHAGE ) 500 MG tablet Take 1 tablet (500 mg total) by mouth 2 (two) times daily with a meal.   rosuvastatin (CRESTOR) 20 MG tablet Take 20 mg by mouth daily. (Patient not taking: Reported on  06/13/2024)   [DISCONTINUED] dicyclomine  (BENTYL ) 20 MG tablet Take 1 tablet (20 mg total) by mouth 2 (two) times daily as needed (for abdominal cramping). (Patient not taking: Reported on 12/23/2017)   No facility-administered encounter medications on file as of 06/24/2024.    ALLERGIES:  No Known Allergies   FAMILY HISTORY:  Family History  Problem Relation Age of Onset   Breast cancer Mother    Breast cancer Maternal Grandmother      SOCIAL HISTORY:  Social Connections: Unknown (12/29/2021)   Received from [facility]   Social Network    Social Network: Not on file    REVIEW OF SYSTEMS:  + vaginal bleeding Denies appetite changes, fevers, chills, fatigue, unexplained weight changes. Denies hearing loss, neck lumps or masses, mouth sores, ringing in ears or voice changes. Denies cough or wheezing.  Denies shortness of breath. Denies chest pain or palpitations. Denies leg swelling. Denies abdominal distention, blood in stools, constipation, diarrhea, nausea, vomiting, or early satiety. Denies pain with intercourse, dysuria, frequency, hematuria or incontinence. Denies hot flashes, pelvic pain or vaginal discharge.   Denies joint pain, back pain or muscle pain/cramps. Denies itching, rash, or wounds. Denies dizziness, headaches, numbness or seizures. Denies swollen lymph nodes or glands, denies easy bruising or bleeding. Denies anxiety, depression, confusion, or decreased concentration.  Physical Exam:  Vital Signs for this encounter:  Pulse 73, temperature 98.1 F (36.7 C), temperature source Oral, resp. rate 20, height 5' 6 (1.676 m), weight 203 lb 3.2 oz (92.2 kg), SpO2 100%. Body mass index is 32.8 kg/m. General: Alert, oriented, no acute distress.  HEENT: Normocephalic, atraumatic. Sclera anicteric.  Chest: Clear to auscultation bilaterally. No wheezes, rhonchi, or rales. Cardiovascular: Regular rate and rhythm, no murmurs, rubs, or gallops.  Abdomen: Normoactive  bowel sounds. Soft, nondistended, nontender to palpation. No masses or hepatosplenomegaly appreciated. No palpable fluid wave.  Well-healed paramedian midline incision on the right. Extremities: Grossly normal range of motion. Warm, well perfused. No edema bilaterally.  Skin: No rashes or lesions.  Lymphatics: No cervical, supraclavicular, or inguinal adenopathy.  GU:  Normal external female genitalia. No lesions. No discharge or bleeding.             Bladder/urethra:  No lesions or masses, well supported bladder             Vagina: Mildly atrophic, no lesions.             Cervix: Normal appearing, no lesions.             Uterus: Small, mobile, no parametrial involvement or nodularity.             Adnexa: No masses appreciated.  Rectal: Deferred.  LABORATORY AND RADIOLOGIC DATA:  Outside medical records were reviewed to synthesize the above history, along with the history and physical obtained during the visit.   Lab Results  Component Value Date   WBC 5.6 06/09/2024   HGB 11.6 (L) 06/09/2024   HCT 35.3 (L) 06/09/2024   PLT 262 06/09/2024   GLUCOSE 119 (H) 06/09/2024   ALT 20 06/09/2024   AST 29 06/09/2024  NA 140 06/09/2024   K 4.0 06/09/2024   CL 103 06/09/2024   CREATININE 1.01 (H) 06/09/2024   BUN 14 06/09/2024   CO2 25 06/09/2024   INR 1.0 06/09/2024

## 2024-06-23 NOTE — H&P (View-Only) (Signed)
 GYNECOLOGIC ONCOLOGY NEW PATIENT CONSULTATION   Patient Name: Katelyn Winters  Patient Age: 68 y.o. 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  We reviewed the nature of endometrial cancer and its recommended surgical staging, including total hysterectomy, bilateral salpingo-oophorectomy, and lymph node assessment. The patient is a suitable candidate for staging via a minimally invasive approach to surgery.  We reviewed that robotic assistance would be used to complete the surgery.   We discussed that most endometrial cancer is detected early and that decisions regarding adjuvant therapy will be made based on her final pathology.   CT scan at time of recent ED visit showed no evidence of metastatic disease.  We reviewed the sentinel lymph node technique. Risks and benefits of sentinel lymph node biopsy was reviewed. We reviewed the technique and ICG dye. The patient DOES NOT have an iodine allergy or known liver dysfunction. We reviewed the false negative rate (0.4%), and that 3% of patients with metastatic disease will not have it detected by SLN biopsy in endometrial cancer. A low risk of allergic reaction to the dye, <0.2% for ICG, has been reported. We also discussed that in the case of failed mapping, which occurs 40% of the time, a bilateral or unilateral lymphadenectomy will be performed at the surgeon's discretion.   Potential benefits of sentinel nodes including a higher detection rate for metastasis due to ultrastaging and potential reduction in operative morbidity. However, there remains uncertainty as to the role for treatment of micrometastatic disease. Further, the benefit of operative morbidity associated with the SLN technique in endometrial cancer is not yet  completely known. In other patient populations (e.g. the cervical cancer population) there has been observed reductions in morbidity with SLN biopsy compared to pelvic lymphadenectomy. Lymphedema, nerve dysfunction and lymphocysts are all potential risks with the SLN technique as with complete lymphadenectomy. Additional risks to the patient include the risk of damage to an internal organ while operating in an altered view (e.g. the black and white image of the robotic fluorescence imaging mode).   We discussed the plan for a robotic assisted hysterectomy, bilateral salpingo-oophorectomy, sentinel lymph node evaluation, possible lymph node dissection, possible laparotomy. The risks of surgery were discussed in detail and she understands these to include infection; wound separation; hernia; vaginal cuff separation, injury to adjacent organs such as bowel, bladder, blood vessels, ureters and nerves; bleeding which may require blood transfusion; anesthesia risk; thromboembolic events; possible death; unforeseen complications; possible need for re-exploration; medical complications such as heart attack, stroke, pleural effusion and pneumonia; and, if full lymphadenectomy is performed the risk of lymphedema and lymphocyst. The patient will receive DVT and antibiotic prophylaxis as indicated. She voiced a clear understanding. She had the opportunity to ask questions. Perioperative instructions were reviewed with her. Prescriptions for post-op medications were sent to her pharmacy of choice.  Reviewed IHC on her biopsy which shows loss of expression of PMS2.  This is highly suggestive of Lynch syndrome.  Discussed this with the patient and recommendation for referral to one of our genetic counselors for genetic testing.  Reviewed that if she has Lynch syndrome, this increases her risk of multiple other cancers, most notably colon cancer.  Because she has not had a colonoscopy in some time, patient was amenable to  having me place a referral for GI.  Also  discussed surgery that she had when she was a baby, reported to be some bladder surgery.  Given remoteness of this surgery, very unlikely that any records are available.  Discussed that there may be adhesions related to this prior surgery.  A copy of this note was sent to the patient's referring provider.   70 minutes of total time was spent for this patient encounter, including preparation, face-to-face counseling with the patient and coordination of care, and documentation of the encounter.  Comer Dollar, MD  Division of Gynecologic Oncology  Department of Obstetrics and Gynecology  [facility]  ___________________________________________  Chief Complaint: No chief complaint on file.   History of Present Illness:  Katelyn Winters is a 68 y.o. y.o. female who is seen in consultation at the request of Dr. Cleatus for an evaluation of endometrial cancer.  Present to ED on 10/10 with at least 10 days of postmenopausal bleeding, associate intermittent cramping.  Transabdominal ultrasound showed unremarkable uterus although endometrium not visualized. Ovaries not visualized.  CT A/P with thickened endometrium (1.2 cm). No adenopathy or other findings concerning for metastatic disease.  Saw Dr. Cleatus on 10/13 and underwent EMB. 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  Patient presents today by herself.  Her niece joins the visit by phone.  She reports overall doing well.  Bleeding started September 1.  She endorses bleeding most days although not enough to need a pad.  She has blood only when she wipes after voiding.  She has had intermittent right lower quadrant pain.  She endorses a good appetite, denies any nausea or emesis other than 1 episode of vomiting after eating.  Endorses normal bowel function.  Describes urinating more slowly but does not feel like she is retaining  urine.  The patient's niece tells me that many of the women on her mother's side of the family including the patient's maternal grandmother, mother, aunts, and cousins have breast cancer.  PAST MEDICAL HISTORY:  Past Medical History:  Diagnosis Date   Asthma    Deaf    Diabetes mellitus without complication (HCC)    Hypertension      PAST SURGICAL HISTORY:  Past Surgical History:  Procedure Laterality Date   BLADDER SURGERY      OB/GYN HISTORY:  OB History  Gravida Para Term Preterm AB Living  0 0 0 0 0 0  SAB IAB Ectopic Multiple Live Births  0 0 0 0 0    No LMP recorded. Patient is postmenopausal.  Age at menarche: 52  Age at menopause: 1 Hx of HRT: denies Hx of STDs: denies Last pap: unsure History of abnormal pap smears: denies  SCREENING STUDIES:  Last mammogram: 2024 per patient  Last colonoscopy: unsure, thinks about 10 years, maybe more  MEDICATIONS: Outpatient Encounter Medications as of 06/24/2024  Medication Sig   albuterol  (PROVENTIL ) (2.5 MG/3ML) 0.083% nebulizer solution Take 3 mLs (2.5 mg total) by nebulization every 6 (six) hours as needed for wheezing or shortness of breath.   albuterol  (VENTOLIN  HFA) 108 (90 Base) MCG/ACT inhaler Inhale 2 puffs into the lungs every 6 (six) hours as needed for wheezing or shortness of breath.   benzonatate  (TESSALON ) 100 MG capsule Take 2 capsules (200 mg total) by mouth 3 (three) times daily as needed for cough. (Patient not taking: Reported on 06/13/2024)   Fluticasone -Umeclidin-Vilant (TRELEGY ELLIPTA ) 200-62.5-25 MCG/ACT AEPB Inhale 1 puff into the lungs daily.   gabapentin (NEURONTIN) 300  MG capsule Take 300 mg by mouth 3 (three) times daily.   lisinopril (ZESTRIL) 5 MG tablet Take 5 mg by mouth daily.   metFORMIN  (GLUCOPHAGE ) 500 MG tablet Take 1 tablet (500 mg total) by mouth 2 (two) times daily with a meal.   rosuvastatin (CRESTOR) 20 MG tablet Take 20 mg by mouth daily. (Patient not taking: Reported on  06/13/2024)   [DISCONTINUED] dicyclomine  (BENTYL ) 20 MG tablet Take 1 tablet (20 mg total) by mouth 2 (two) times daily as needed (for abdominal cramping). (Patient not taking: Reported on 12/23/2017)   No facility-administered encounter medications on file as of 06/24/2024.    ALLERGIES:  No Known Allergies   FAMILY HISTORY:  Family History  Problem Relation Age of Onset   Breast cancer Mother    Breast cancer Maternal Grandmother      SOCIAL HISTORY:  Social Connections: Unknown (12/29/2021)   Received from [facility]   Social Network    Social Network: Not on file    REVIEW OF SYSTEMS:  + vaginal bleeding Denies appetite changes, fevers, chills, fatigue, unexplained weight changes. Denies hearing loss, neck lumps or masses, mouth sores, ringing in ears or voice changes. Denies cough or wheezing.  Denies shortness of breath. Denies chest pain or palpitations. Denies leg swelling. Denies abdominal distention, blood in stools, constipation, diarrhea, nausea, vomiting, or early satiety. Denies pain with intercourse, dysuria, frequency, hematuria or incontinence. Denies hot flashes, pelvic pain or vaginal discharge.   Denies joint pain, back pain or muscle pain/cramps. Denies itching, rash, or wounds. Denies dizziness, headaches, numbness or seizures. Denies swollen lymph nodes or glands, denies easy bruising or bleeding. Denies anxiety, depression, confusion, or decreased concentration.  Physical Exam:  Vital Signs for this encounter:  Pulse 73, temperature 98.1 F (36.7 C), temperature source Oral, resp. rate 20, height 5' 6 (1.676 m), weight 203 lb 3.2 oz (92.2 kg), SpO2 100%. Body mass index is 32.8 kg/m. General: Alert, oriented, no acute distress.  HEENT: Normocephalic, atraumatic. Sclera anicteric.  Chest: Clear to auscultation bilaterally. No wheezes, rhonchi, or rales. Cardiovascular: Regular rate and rhythm, no murmurs, rubs, or gallops.  Abdomen: Normoactive  bowel sounds. Soft, nondistended, nontender to palpation. No masses or hepatosplenomegaly appreciated. No palpable fluid wave.  Well-healed paramedian midline incision on the right. Extremities: Grossly normal range of motion. Warm, well perfused. No edema bilaterally.  Skin: No rashes or lesions.  Lymphatics: No cervical, supraclavicular, or inguinal adenopathy.  GU:  Normal external female genitalia. No lesions. No discharge or bleeding.             Bladder/urethra:  No lesions or masses, well supported bladder             Vagina: Mildly atrophic, no lesions.             Cervix: Normal appearing, no lesions.             Uterus: Small, mobile, no parametrial involvement or nodularity.             Adnexa: No masses appreciated.  Rectal: Deferred.  LABORATORY AND RADIOLOGIC DATA:  Outside medical records were reviewed to synthesize the above history, along with the history and physical obtained during the visit.   Lab Results  Component Value Date   WBC 5.6 06/09/2024   HGB 11.6 (L) 06/09/2024   HCT 35.3 (L) 06/09/2024   PLT 262 06/09/2024   GLUCOSE 119 (H) 06/09/2024   ALT 20 06/09/2024   AST 29 06/09/2024  NA 140 06/09/2024   K 4.0 06/09/2024   CL 103 06/09/2024   CREATININE 1.01 (H) 06/09/2024   BUN 14 06/09/2024   CO2 25 06/09/2024   INR 1.0 06/09/2024

## 2024-06-24 ENCOUNTER — Inpatient Hospital Stay: Admitting: Gynecologic Oncology

## 2024-06-24 ENCOUNTER — Inpatient Hospital Stay: Attending: Gynecologic Oncology | Admitting: Gynecologic Oncology

## 2024-06-24 ENCOUNTER — Telehealth: Payer: Self-pay | Admitting: *Deleted

## 2024-06-24 ENCOUNTER — Encounter: Payer: Self-pay | Admitting: Gynecologic Oncology

## 2024-06-24 VITALS — HR 73 | Temp 98.1°F | Resp 20 | Ht 66.0 in | Wt 203.2 lb

## 2024-06-24 DIAGNOSIS — Z7984 Long term (current) use of oral hypoglycemic drugs: Secondary | ICD-10-CM | POA: Insufficient documentation

## 2024-06-24 DIAGNOSIS — Z1211 Encounter for screening for malignant neoplasm of colon: Secondary | ICD-10-CM | POA: Diagnosis not present

## 2024-06-24 DIAGNOSIS — N950 Postmenopausal bleeding: Secondary | ICD-10-CM | POA: Insufficient documentation

## 2024-06-24 DIAGNOSIS — I10 Essential (primary) hypertension: Secondary | ICD-10-CM | POA: Diagnosis not present

## 2024-06-24 DIAGNOSIS — C541 Malignant neoplasm of endometrium: Secondary | ICD-10-CM | POA: Diagnosis present

## 2024-06-24 DIAGNOSIS — Z803 Family history of malignant neoplasm of breast: Secondary | ICD-10-CM | POA: Insufficient documentation

## 2024-06-24 DIAGNOSIS — H9190 Unspecified hearing loss, unspecified ear: Secondary | ICD-10-CM | POA: Diagnosis not present

## 2024-06-24 DIAGNOSIS — E119 Type 2 diabetes mellitus without complications: Secondary | ICD-10-CM | POA: Diagnosis not present

## 2024-06-24 DIAGNOSIS — Z79899 Other long term (current) drug therapy: Secondary | ICD-10-CM | POA: Diagnosis not present

## 2024-06-24 DIAGNOSIS — J45909 Unspecified asthma, uncomplicated: Secondary | ICD-10-CM | POA: Insufficient documentation

## 2024-06-24 MED ORDER — TRAMADOL HCL 50 MG PO TABS: 50.0000 mg | ORAL_TABLET | Freq: Four times a day (QID) | ORAL | 0 refills | Status: AC | PRN

## 2024-06-24 MED ORDER — SENNOSIDES-DOCUSATE SODIUM 8.6-50 MG PO TABS: 2.0000 | ORAL_TABLET | Freq: Every day | ORAL | 0 refills | Status: AC

## 2024-06-24 NOTE — Progress Notes (Signed)
 Patient here for a consult with Dr. Viktoria and for a pre-operative appointment prior to her scheduled surgery on 07/07/2024. She is scheduled for a robotic assisted total laparoscopic hysterectomy, bilateral salpingo-oophorectomy, sentinel lymph node biopsy, possible lymph node dissection, possible laparotomy.  The surgery was discussed in detail with the assistance of a medical interpreter.  See after visit summary for additional details.       Discussed post-op pain management in detail including the aspects of the enhanced recovery pathway.  Advised her that a new prescription would be sent in for Tramadol and it is only to be used for after her upcoming surgery.  We discussed the use of tylenol  post-op and to monitor for a maximum of 4,000 mg in a 24 hour period.  Also discussed that sennakot will be prescribed to be used after surgery and to hold if having loose stools.  Discussed bowel regimen in detail.     Discussed the use of SCDs and measures to take at home to prevent DVT including frequent mobility.  Reportable signs and symptoms of DVT discussed. Post-operative instructions discussed and expectations for after surgery. Incisional care discussed as well including reportable signs and symptoms including erythema, drainage, wound separation.     30 minutes spent with the patient.  Verbalizing understanding of material discussed. No needs or concerns voiced at the end of the visit.   Advised patient to call for any needs.  Advised that her post-operative medications had been prescribed and could be picked up at any time.    This appointment is included in the global surgical bundle as pre-operative teaching and has no charge.

## 2024-06-24 NOTE — Telephone Encounter (Signed)
 Per Dr Viktoria fax records and surgical optimization form to the patient's PCP office Teddi Patient, NP [phone])

## 2024-06-24 NOTE — Patient Instructions (Addendum)
 Preparing for your Surgery  Plan for surgery on July 07, 2024 with Dr. Comer Dollar at [facility]. You will be scheduled for robotic assisted total laparoscopic hysterectomy (removal of the uterus and cervix), bilateral salpingo-oophorectomy (removal of both ovaries and fallopian tubes), sentinel lymph node biopsy, possible lymph node dissection, possible laparotomy (larger incision on your abdomen if needed).   We will contact you if a sooner opening for surgery comes available.  Pre-operative Testing -You will receive a phone call from presurgical testing at [facility] to arrange for a pre-operative appointment and lab work.  -Bring your insurance card, copy of an advanced directive if applicable, medication list  -At that visit, you will be asked to sign a consent for a possible blood transfusion in case a transfusion becomes necessary during surgery.  The need for a blood transfusion is rare but having consent is a necessary part of your care.     -You should not be taking blood thinners or aspirin at least ten days prior to surgery unless instructed by your surgeon.  -Do not take supplements such as fish oil (omega 3), red yeast rice, turmeric before your surgery. STOP TAKING AT LEAST 10 DAYS BEFORE SURGERY. You want to avoid medications with aspirin in them including headache powders such as BC or Goody's), Excedrin migraine.  -If you are taking a GLP-1 medication/injection such as Ozempic, Mounjaro, [id number], this needs to be held before surgery for at least 7 days before.  Day Before Surgery at Home -You will be asked to take in a light diet the day before surgery. You will be advised you can have clear liquids up until 3 hours before your surgery.    Eat a light diet the day before surgery.  Examples including soups, broths, toast, yogurt, mashed potatoes.  AVOID GAS PRODUCING FOODS AND BEVERAGES. Things to avoid include carbonated beverages (fizzy beverages,  sodas), raw fruits and raw vegetables (uncooked), or beans.   If your bowels are filled with gas, your surgeon will have difficulty visualizing your pelvic organs which increases your surgical risks.  Your role in recovery Your role is to become active as soon as directed by your doctor, while still giving yourself time to heal.  Rest when you feel tired. You will be asked to do the following in order to speed your recovery:  - Cough and breathe deeply. This helps to clear and expand your lungs and can prevent pneumonia after surgery.  - STAY ACTIVE WHEN YOU GET HOME. Do mild physical activity. Walking or moving your legs help your circulation and body functions return to normal. Do not try to get up or walk alone the first time after surgery.   -If you develop swelling on one leg or the other, pain in the back of your leg, redness/warmth in one of your legs, please call the office or go to the Emergency Room to have a doppler to rule out a blood clot. For shortness of breath, chest pain-seek care in the Emergency Room as soon as possible. - Actively manage your pain. Managing your pain lets you move in comfort. We will ask you to rate your pain on a scale of zero to 10. It is your responsibility to tell your doctor or nurse where and how much you hurt so your pain can be treated.  Special Considerations -If you are diabetic, you may be placed on insulin after surgery to have closer control over your blood sugars to promote healing  and recovery.  This does not mean that you will be discharged on insulin.  If applicable, your oral antidiabetics will be resumed when you are tolerating a solid diet.  -Your final pathology results from surgery should be available around one week after surgery and the results will be relayed to you when available.  -FMLA forms can be faxed to [phone] and please allow 5-7 business days for completion.  Pain Management After Surgery -You will be prescribed your  pain medication and bowel regimen medications before surgery so that you can have these available when you are discharged from the hospital. The pain medication is for use ONLY AFTER surgery and a new prescription will not be given.   -Make sure that you have Tylenol  and Ibuprofen  IF YOU ARE ABLE TO TAKE THESE MEDICATIONS at home to use on a regular basis after surgery for pain control. We recommend alternating the medications every hour to six hours since they work differently and are processed in the body differently for pain relief.  -Review the attached handout on narcotic use and their risks and side effects.   Bowel Regimen -You will be prescribed Sennakot-S to take nightly to prevent constipation especially if you are taking the narcotic pain medication intermittently.  It is important to prevent constipation and drink adequate amounts of liquids. You can stop taking this medication when you are not taking pain medication and you are back on your normal bowel routine.  Risks of Surgery Risks of surgery are low but include bleeding, infection, damage to surrounding structures, re-operation, blood clots, and very rarely death.   Blood Transfusion Information (For the consent to be signed before surgery)  We will be checking your blood type before surgery so in case of emergencies, we will know what type of blood you would need.                                            WHAT IS A BLOOD TRANSFUSION?  A transfusion is the replacement of blood or some of its parts. Blood is made up of multiple cells which provide different functions. Red blood cells carry oxygen and are used for blood loss replacement. White blood cells fight against infection. Platelets control bleeding. Plasma helps clot blood. Other blood products are available for specialized needs, such as hemophilia or other clotting disorders. BEFORE THE TRANSFUSION  Who gives blood for transfusions?  You may be able to donate  blood to be used at a later date on yourself (autologous donation). Relatives can be asked to donate blood. This is generally not any safer than if you have received blood from a stranger. The same precautions are taken to ensure safety when a relative's blood is donated. Healthy volunteers who are fully evaluated to make sure their blood is safe. This is blood bank blood. Transfusion therapy is the safest it has ever been in the practice of medicine. Before blood is taken from a donor, a complete history is taken to make sure that person has no history of diseases nor engages in risky social behavior (examples are intravenous drug use or sexual activity with multiple partners). The donor's travel history is screened to minimize risk of transmitting infections, such as malaria. The donated blood is tested for signs of infectious diseases, such as HIV and hepatitis. The blood is then tested to be sure it is  compatible with you in order to minimize the chance of a transfusion reaction. If you or a relative donates blood, this is often done in anticipation of surgery and is not appropriate for emergency situations. It takes many days to process the donated blood. RISKS AND COMPLICATIONS Although transfusion therapy is very safe and saves many lives, the main dangers of transfusion include:  Getting an infectious disease. Developing a transfusion reaction. This is an allergic reaction to something in the blood you were given. Every precaution is taken to prevent this. The decision to have a blood transfusion has been considered carefully by your caregiver before blood is given. Blood is not given unless the benefits outweigh the risks.  AFTER SURGERY INSTRUCTIONS  Return to work: 4-6 weeks if applicable  Activity: 1. Be up and out of the bed during the day.  Take a nap if needed.  You may walk up steps but be careful and use the hand rail.  Stair climbing will tire you more than you think, you may need to  stop part way and rest.   2. No lifting or straining for 6 weeks over 10 pounds. No pushing, pulling, straining for 6 weeks.  3. No driving for 4-89 days when the following criteria have been met: Do not drive if you are taking narcotic pain medicine and make sure that your reaction time has returned.   4. You can shower as soon as the next day after surgery. Shower daily.  Use your regular soap and water (not directly on the incision) and pat your incision(s) dry afterwards; don't rub.  No tub baths or submerging your body in water until cleared by your surgeon. If you have the soap that was given to you by pre-surgical testing that was used before surgery, you do not need to use it afterwards because this can irritate your incisions.   5. No sexual activity and nothing in the vagina for 12 weeks.  6. You may experience a small amount of clear drainage from your incisions, which is normal.  If the drainage persists, increases, or changes color please call the office.  7. Do not use creams, lotions, or ointments such as neosporin on your incisions after surgery until advised by your surgeon because they can cause removal of the dermabond glue on your incisions.    8. You may experience vaginal spotting after surgery or when the stitches at the top of the vagina begin to dissolve.  The spotting is normal but if you experience heavy bleeding, call our office.  9. Take Tylenol  or ibuprofen  first for pain if you are able to take these medications and only use narcotic pain medication for severe pain not relieved by the Tylenol  or Ibuprofen .  Monitor your Tylenol  intake to a max of 4,000 mg in a 24 hour period. You can alternate these medications after surgery.  Diet: 1. Low sodium Heart Healthy Diet is recommended but you are cleared to resume your normal (before surgery) diet after your procedure.  2. It is safe to use a laxative, such as Miralax or Colace, if you have difficulty moving your bowels  before surgery. You have been prescribed Sennakot-S to take at bedtime every evening after surgery to keep bowel movements regular and to prevent constipation.    Wound Care: 1. Keep clean and dry.  Shower daily.  Reasons to call the Doctor: Fever - Oral temperature greater than 100.4 degrees Fahrenheit Foul-smelling vaginal discharge Difficulty urinating Nausea and vomiting Increased  pain at the site of the incision that is unrelieved with pain medicine. Difficulty breathing with or without chest pain New calf pain especially if only on one side Sudden, continuing increased vaginal bleeding with or without clots.   Contacts: For questions or concerns you should contact:  Dr. Comer  at [phone]  Eleanor Epps, NP at [phone]  After Hours: call [phone] and have the GYN Oncologist paged/contacted (after 5 pm or on the weekends). You will speak with an after hours RN and let he or she know you have had surgery.  Messages sent via mychart are for non-urgent matters and are not responded to after hours so for urgent needs, please call the after hours number.

## 2024-06-27 ENCOUNTER — Encounter (HOSPITAL_COMMUNITY)

## 2024-06-28 NOTE — Patient Instructions (Addendum)
 SURGICAL WAITING ROOM VISITATION Patients having surgery or a procedure may have no more than 2 support people in the waiting area - these visitors may rotate.    Children under the age of 43 must have an adult with them who is not the patient.  If the patient needs to stay at the hospital during part of their recovery, the visitor guidelines for inpatient rooms apply. Pre-op nurse will coordinate an appropriate time for 1 support person to accompany patient in pre-op.  This support person may not rotate.    Please refer to the [facility] website for the visitor guidelines for Inpatients (after your surgery is over and you are in a regular room).       Your procedure is scheduled on: 07-07-24   Report to [facility] Main Entrance    Report to admitting at 5:15 AM   Call this number if you have problems the morning of surgery [phone]   Follow a light diet the day before surgery (avoid gas producing foods)   Do not eat food :After Midnight.   After Midnight you may have the following liquids until 4:30 AM DAY OF SURGERY  Water Non-Citrus Juices (without pulp, NO RED-Apple, White grape, White cranberry) Black Coffee (NO MILK/CREAM OR CREAMERS, sugar ok)  Clear Tea (NO MILK/CREAM OR CREAMERS, sugar ok) regular and decaf                             Plain Jell-O (NO RED)                                           Fruit ices (not with fruit pulp, NO RED)                                     Popsicles (NO RED)                                                               Sports drinks like Gatorade (NO RED)                      If you have questions, please contact your surgeon's office.   FOLLOW BOWEL PREP AND ANY ADDITIONAL PRE OP INSTRUCTIONS YOU RECEIVED FROM YOUR SURGEON'S OFFICE!!!     Oral Hygiene is also important to reduce your risk of infection.                                    Remember - BRUSH YOUR TEETH THE MORNING OF SURGERY WITH YOUR REGULAR  TOOTHPASTE   Do NOT smoke after Midnight   Take these medicines the morning of surgery with A SIP OF WATER:   None  Okay to use inhalers  Stop all vitamins and herbal supplements 7 days before surgery  How to Manage Your Diabetes Before and After Surgery  Why is it important to control my blood sugar before and after surgery? Improving blood sugar  levels before and after surgery helps healing and can limit problems. A way of improving blood sugar control is eating a healthy diet by:  Eating less sugar and carbohydrates  Increasing activity/exercise  Talking with your doctor about reaching your blood sugar goals High blood sugars (greater than 180 mg/dL) can raise your risk of infections and slow your recovery, so you will need to focus on controlling your diabetes during the weeks before surgery. Make sure that the doctor who takes care of your diabetes knows about your planned surgery including the date and location.  How do I manage my blood sugar before surgery? Check your blood sugar at least 4 times a day, starting 2 days before surgery, to make sure that the level is not too high or low. Check your blood sugar the morning of your surgery when you wake up and every 2 hours until you get to the Short Stay unit. If your blood sugar is less than 70 mg/dL, you will need to treat for low blood sugar: Do not take insulin. Treat a low blood sugar (less than 70 mg/dL) with  cup of clear juice (cranberry or apple), 4 glucose tablets, OR glucose gel. Recheck blood sugar in 15 minutes after treatment (to make sure it is greater than 70 mg/dL). If your blood sugar is not greater than 70 mg/dL on recheck, call [phone] for further instructions. Report your blood sugar to the short stay nurse when you get to Short Stay.  If you are admitted to the hospital after surgery: Your blood sugar will be checked by the staff and you will probably be given insulin after surgery (instead of oral  diabetes medicines) to make sure you have good blood sugar levels. The goal for blood sugar control after surgery is 80-180 mg/dL.   WHAT DO I DO ABOUT MY DIABETES MEDICATION?  Do not take oral diabetes medicines (pills) the morning of surgery (do not take Metformin  the morning of surgery).  DO NOT TAKE THE FOLLOWING 7 DAYS PRIOR TO SURGERY: Ozempic, Wegovy, Rybelsus (Semaglutide), Byetta (exenatide), Bydureon (exenatide ER), Victoza, Saxenda (liraglutide), or Trulicity (dulaglutide) Mounjaro (Tirzepatide) Adlyxin (Lixisenatide), Polyethylene Glycol Loxenatide.  Reviewed and Endorsed by [facility] Patient Education Committee, August 2015                              You may not have any metal on your body including hair pins, jewelry, and body piercing             Do not wear make-up, lotions, powders, perfumes or deodorant  Do not wear nail polish including gel and S&S, artificial/acrylic nails, or any other type of covering on natural nails including finger and toenails. If you have artificial nails, gel coating, etc. that needs to be removed by a nail salon please have this removed prior to surgery or surgery may need to be canceled/ delayed if the surgeon/ anesthesia feels like they are unable to be safely monitored.   Do not shave  48 hours prior to surgery.            Do not bring valuables to the hospital. [facility] IS NOT RESPONSIBLE   FOR VALUABLES.   Contacts, dentures or bridgework may not be worn into surgery.  DO NOT BRING YOUR HOME MEDICATIONS TO THE HOSPITAL. PHARMACY WILL DISPENSE MEDICATIONS LISTED ON YOUR MEDICATION LIST TO YOU DURING YOUR ADMISSION IN THE HOSPITAL!    Patients  discharged on the day of surgery will not be allowed to drive home.  Someone NEEDS to stay with you for the first 24 hours after anesthesia.              Please read over the following fact sheets you were given: IF YOU HAVE QUESTIONS ABOUT YOUR PRE-OP INSTRUCTIONS PLEASE CALL [phone]  [place]  If you received a COVID test during your pre-op visit  it is requested that you wear a mask when out in public, stay away from anyone that may not be feeling well and notify your surgeon if you develop symptoms. If you test positive for Covid or have been in contact with anyone that has tested positive in the last 10 days please notify you surgeon.  [facility] - Preparing for Surgery Before surgery, you can play an important role.  Because skin is not sterile, your skin needs to be as free of germs as possible.  You can reduce the number of germs on your skin by washing with CHG (chlorahexidine gluconate) soap before surgery.  CHG is an antiseptic cleaner which kills germs and bonds with the skin to continue killing germs even after washing. Please DO NOT use if you have an allergy to CHG or antibacterial soaps.  If your skin becomes reddened/irritated stop using the CHG and inform your nurse when you arrive at Short Stay. Do not shave (including legs and underarms) for at least 48 hours prior to the first CHG shower.  You may shave your face/neck.  Please follow these instructions carefully:  1.  Shower with CHG Soap the night before surgery ONLY (DO NOT USE THE SOAP THE MORNING OF SURGERY).  2.  If you choose to wash your hair, wash your hair first as usual with your normal  shampoo.  3.  After you shampoo, rinse your hair and body thoroughly to remove the shampoo.                             4.  Use CHG as you would any other liquid soap.  You can apply chg directly to the skin and wash.  Gently with a scrungie or clean washcloth.  5.  Apply the CHG Soap to your body ONLY FROM THE NECK DOWN.   Do   not use on face/ open                           Wound or open sores. Avoid contact with eyes, ears mouth and   genitals (private parts).                       Wash face,  Genitals (private parts) with your normal soap.             6.  Wash thoroughly, paying special attention to the area where  your    surgery  will be performed.  7.  Thoroughly rinse your body with warm water from the neck down.  8.  DO NOT shower/wash with your normal soap after using and rinsing off the CHG Soap.                9.  Pat yourself dry with a clean towel.            10.  Wear clean pajamas.            11.  Place clean sheets on your bed the night of your first shower and do not  sleep with pets. Day of Surgery : Do not apply any CHG, lotions/deodorants the morning of surgery.  Please wear clean clothes to the hospital/surgery center.  FAILURE TO FOLLOW THESE INSTRUCTIONS MAY RESULT IN THE CANCELLATION OF YOUR SURGERY  PATIENT SIGNATURE_________________________________  NURSE SIGNATURE__________________________________  ________________________________________________________________________  WHAT IS A BLOOD TRANSFUSION? Blood Transfusion Information  A transfusion is the replacement of blood or some of its parts. Blood is made up of multiple cells which provide different functions. Red blood cells carry oxygen and are used for blood loss replacement. White blood cells fight against infection. Platelets control bleeding. Plasma helps clot blood. Other blood products are available for specialized needs, such as hemophilia or other clotting disorders. BEFORE THE TRANSFUSION  Who gives blood for transfusions?  Healthy volunteers who are fully evaluated to make sure their blood is safe. This is blood bank blood. Transfusion therapy is the safest it has ever been in the practice of medicine. Before blood is taken from a donor, a complete history is taken to make sure that person has no history of diseases nor engages in risky social behavior (examples are intravenous drug use or sexual activity with multiple partners). The donor's travel history is screened to minimize risk of transmitting infections, such as malaria. The donated blood is tested for signs of infectious diseases, such as HIV and  hepatitis. The blood is then tested to be sure it is compatible with you in order to minimize the chance of a transfusion reaction. If you or a relative donates blood, this is often done in anticipation of surgery and is not appropriate for emergency situations. It takes many days to process the donated blood. RISKS AND COMPLICATIONS Although transfusion therapy is very safe and saves many lives, the main dangers of transfusion include:  Getting an infectious disease. Developing a transfusion reaction. This is an allergic reaction to something in the blood you were given. Every precaution is taken to prevent this. The decision to have a blood transfusion has been considered carefully by your caregiver before blood is given. Blood is not given unless the benefits outweigh the risks. AFTER THE TRANSFUSION Right after receiving a blood transfusion, you will usually feel much better and more energetic. This is especially true if your red blood cells have gotten low (anemic). The transfusion raises the level of the red blood cells which carry oxygen, and this usually causes an energy increase. The nurse administering the transfusion will monitor you carefully for complications. HOME CARE INSTRUCTIONS  No special instructions are needed after a transfusion. You may find your energy is better. Speak with your caregiver about any limitations on activity for underlying diseases you may have. SEEK MEDICAL CARE IF:  Your condition is not improving after your transfusion. You develop redness or irritation at the intravenous (IV) site. SEEK IMMEDIATE MEDICAL CARE IF:  Any of the following symptoms occur over the next 12 hours: Shaking chills. You have a temperature by mouth above 102 F (38.9 C), not controlled by medicine. Chest, back, or muscle pain. People around you feel you are not acting correctly or are confused. Shortness of breath or difficulty breathing. Dizziness and fainting. You get a rash or  develop hives. You have a decrease in urine output. Your urine turns a dark color or changes to pink, red, or brown. Any of the following symptoms occur over the next 10 days: You have a  temperature by mouth above 102 F (38.9 C), not controlled by medicine. Shortness of breath. Weakness after normal activity. The white part of the eye turns yellow (jaundice). You have a decrease in the amount of urine or are urinating less often. Your urine turns a dark color or changes to pink, red, or brown. Document Released: 08/15/2000 Document Revised: 11/10/2011 Document Reviewed: 04/03/2008 [facility] Patient Information 2014 [place], [place].  _______________________________________________________________________

## 2024-06-30 NOTE — Progress Notes (Signed)
  Date of COVID positive in last 90 days: No  PCP - Lauraine Patient, PA-C Cardiologist -  N/A Pulmonologist - Donnice Beals, MD  Chest x-ray - 05-23-24 CEW EKG - 04-24-24 [facility]W (copy in media) Stress Test - N/A ECHO - N/A Cardiac Cath -  Pacemaker/ICD device last checked:N/A Spinal Cord Stimulator:N/A  Bowel Prep - N/A  Sleep Study - N/A CPAP -   Fasting Blood Sugar - 97 to 100 Checks Blood Sugar 2  times a day  Last dose of GLP1 agonist-  N/A GLP1 instructions:  Do not take after     Last dose of SGLT-2 inhibitors-  N/A SGLT-2 instructions:  Do not take after     Blood Thinner Instructions: N/A Last dose:   Time: Aspirin Instructions:N/A Last Dose:  Activity level:  Can go up a flight of stairs and perform activities of daily living without stopping and without symptoms of chest pain or shortness of breath.  Anesthesia review: COPD, DM, HTN, asthma (3 ER visits since August for asthma)  Patient denies shortness of breath, fever, cough and chest pain at PAT appointment  Patient verbalized understanding of instructions that were given to them at the PAT appointment. Patient was also instructed that they will need to review over the PAT instructions again at home before surgery.

## 2024-07-01 ENCOUNTER — Encounter (HOSPITAL_COMMUNITY): Payer: Self-pay

## 2024-07-01 ENCOUNTER — Other Ambulatory Visit: Payer: Self-pay

## 2024-07-01 ENCOUNTER — Encounter (HOSPITAL_COMMUNITY)
Admission: RE | Admit: 2024-07-01 | Discharge: 2024-07-01 | Disposition: A | Discharge status: Home / Self Care | Source: Ambulatory Visit | Attending: Gynecologic Oncology | Admitting: Gynecologic Oncology

## 2024-07-01 VITALS — BP 133/82 | HR 84 | Temp 98.3°F | Resp 16 | Ht 66.0 in | Wt 200.6 lb

## 2024-07-01 DIAGNOSIS — E119 Type 2 diabetes mellitus without complications: Secondary | ICD-10-CM | POA: Diagnosis not present

## 2024-07-01 DIAGNOSIS — C541 Malignant neoplasm of endometrium: Secondary | ICD-10-CM | POA: Diagnosis not present

## 2024-07-01 DIAGNOSIS — Z01812 Encounter for preprocedural laboratory examination: Secondary | ICD-10-CM | POA: Insufficient documentation

## 2024-07-01 HISTORY — DX: Chronic obstructive pulmonary disease, unspecified: J44.9

## 2024-07-01 HISTORY — DX: Malignant neoplasm of endometrium: C54.1

## 2024-07-01 LAB — COMPREHENSIVE METABOLIC PANEL WITH GFR
ALT: 9 U/L (ref 0–44)
AST: 19 U/L (ref 15–41)
Albumin: 4.3 g/dL (ref 3.5–5.0)
Alkaline Phosphatase: 84 U/L (ref 38–126)
Anion gap: 11 (ref 5–15)
BUN: 17 mg/dL (ref 8–23)
CO2: 26 mmol/L (ref 22–32)
Calcium: 9.8 mg/dL (ref 8.9–10.3)
Chloride: 103 mmol/L (ref 98–111)
Creatinine, Ser: 0.95 mg/dL (ref 0.44–1.00)
GFR, Estimated: >60 mL/min (ref >60)
Glucose, Bld: 148 mg/dL — ABNORMAL HIGH (ref 70–99)
Potassium: 4.3 mmol/L (ref 3.5–5.1)
Sodium: 139 mmol/L (ref 135–145)
Total Bilirubin: 0.3 mg/dL (ref 0.0–1.2)
Total Protein: 7.5 g/dL (ref 6.5–8.1)

## 2024-07-01 LAB — CBC
HCT: 39.3 % (ref 36.0–46.0)
Hemoglobin: 12.6 g/dL (ref 12.0–15.0)
MCH: 32.6 pg (ref 26.0–34.0)
MCHC: 32.1 g/dL (ref 30.0–36.0)
MCV: 101.6 fL — ABNORMAL HIGH (ref 80.0–100.0)
Platelets: 288 K/uL (ref 150–400)
RBC: 3.87 MIL/uL (ref 3.87–5.11)
RDW: 12.3 % (ref 11.5–15.5)
WBC: 3.9 K/uL — ABNORMAL LOW (ref 4.0–10.5)
nRBC: 0 % (ref 0.0–0.2)

## 2024-07-01 LAB — HEMOGLOBIN A1C
Hgb A1c MFr Bld: 5.8 % — ABNORMAL HIGH (ref 4.8–5.6)
Mean Plasma Glucose: 119.76 mg/dL

## 2024-07-01 LAB — GLUCOSE, CAPILLARY: Glucose-Capillary: 143 mg/dL — ABNORMAL HIGH (ref 70–99)

## 2024-07-01 NOTE — Progress Notes (Signed)
 Patient: Katelyn Winters  DOB: 1956-05-14, age 68 y.o. MRN: [id number]  PCP: Lauraine Patient, PA  Assessment and Plan   1. Sign Supported English language interpreter needed      2. Type 2 diabetes mellitus without complication, without long-term current use of insulin (*)      3. Endometrial cancer (*)      4. Lynch syndrome        Assessment & Plan 1. Abnormal uterine bleeding: - She experienced significant abnormal uterine bleeding, which led to an emergency room visit. - Imaging revealed thickening of the uterine lining. Her hemoglobin levels are currently within the normal range, and her blood type is B positive. - She is scheduled for a hysterectomy and oophorectomy on 07/07/2024. Pathological examination will determine if the abnormality is confined to the lining, which may negate the need for further treatment. - A blood transfusion may be required during the surgery if excessive bleeding occurs. She is advised to adhere strictly to all post-operative instructions.    No follow-ups on file.   Risks, benefits, and alternatives of the medications and treatment plan prescribed today were discussed, and patient expressed understanding. Answered all questions and addressed all concerns to the patient's satisfaction.  Plan follow-up as discussed or as needed if any worsening symptoms or change in condition.  Patient voiced understanding of the treatment plan and agreed to attempt to comply.  Designer, fashion/clothing may have been used to create parts of the visit note. Consent from the patient/caregiver was obtained prior to its use.  See after visit summary for patient specific instructions.    Subjective   Katelyn Winters is a 68 y.o. female who presents with:     Patient presents with  . Consultation     Pt presents to clinic for discussion of recent diagnosis of endometrial cancer  10/9 went to ED postmenopausal vaginal bleeding  On 10/13 went saw  Gyn with normal pap smear and endometrial biposy which showed endometrial adenocarcinoma and las which demonstrate lynch syndrome  She had a cologaurd 03/2022 but will need a colonoscopy for further evaluation  Last mammogram - 08/2023 - already scheduled for 08/23/2024  11/6 hysterectomy is scheduled - Notes were reviewed   Patient has family coming to help take care of her after her procedure.  She has seen oncology with [facility].  ---------------------------------------------------------------- Computer generated note History of Present Illness The patient presents via virtual visit for evaluation of abnormal uterine bleeding.  She sought emergency care due to an abnormal menstrual cycle, which was characterized by significant bleeding. She has undergone imaging studies, including an MRI, and is scheduled for a hysterectomy and oophorectomy on 07/07/2024. She is uncertain about the necessity of chemotherapy or radiation therapy post-surgery. Her brother and niece are expected to arrive on 07/05/2024 to provide post-operative support. She has been informed that a blood transfusion may be required during the surgical procedure.  History was obtained with [facility] [phone] through interpretor services.   Reviewed and updated this visit by provider: Tobacco  Allergies  Meds  Problems  Med Hx  Surg Hx  Fam Hx  PDMP        Review of Systems  Genitourinary:  Positive for menstrual problem (resolved now).        Objective   Vitals:   07/01/24 1116  BP: 122/68  Patient Position: Sitting  Pulse: 78  Temp: 97.3 F (36.3 C)  TempSrc: Temporal  Resp: 17  Height: 5'  6 (1.676 m)  Weight: 202 lb 6.4 oz (91.8 kg)  SpO2: 95%  BMI (Calculated): 32.7    Physical Exam Vitals and nursing note reviewed.  Constitutional:      Appearance: Normal appearance.  HENT:     Head: Normocephalic and atraumatic.     Right Ear: External ear normal.     Left Ear: External ear normal.     Nose:  Nose normal.  Eyes:     Conjunctiva/sclera: Conjunctivae normal.  Musculoskeletal:     Cervical back: Normal range of motion.  Pulmonary:     Effort: Pulmonary effort is normal.  Skin:    General: Skin is warm and dry.  Neurological:     Mental Status: She is alert and oriented to person, place, and time.     Gait: Gait normal.  Psychiatric:        Mood and Affect: Mood normal.        Behavior: Behavior normal.        Thought Content: Thought content normal.        Judgment: Judgment normal.        Lauraine Allen DEVONNA [facility]  07/01/2024

## 2024-07-05 NOTE — Discharge Instructions (Signed)
 AFTER SURGERY INSTRUCTIONS   Return to work: 4-6 weeks if applicable  You had some tears occur in the vagina with removal of the uterus. It can be expected to have soreness in the vagina/vulva.   Activity: 1. Be up and out of the bed during the day.  Take a nap if needed.  You may walk up steps but be careful and use the hand rail.  Stair climbing will tire you more than you think, you may need to stop part way and rest.    2. No lifting or straining for 6 weeks over 10 pounds. No pushing, pulling, straining for 6 weeks.   3. No driving for 4-89 days when the following criteria have been met: Do not drive if you are taking narcotic pain medicine and make sure that your reaction time has returned.    4. You can shower as soon as the next day after surgery. Shower daily.  Use your regular soap and water (not directly on the incision) and pat your incision(s) dry afterwards; don't rub.  No tub baths or submerging your body in water until cleared by your surgeon. If you have the soap that was given to you by pre-surgical testing that was used before surgery, you do not need to use it afterwards because this can irritate your incisions.    5. No sexual activity and nothing in the vagina for 12 weeks.   6. You may experience a small amount of clear drainage from your incisions, which is normal.  If the drainage persists, increases, or changes color please call the office.   7. Do not use creams, lotions, or ointments such as neosporin on your incisions after surgery until advised by your surgeon because they can cause removal of the dermabond glue on your incisions.     8. You may experience vaginal spotting after surgery or when the stitches at the top of the vagina begin to dissolve.  The spotting is normal but if you experience heavy bleeding, call our office.   9. Take Tylenol  or ibuprofen  first for pain if you are able to take these medications and only use narcotic pain medication for severe  pain not relieved by the Tylenol  or Ibuprofen .  Monitor your Tylenol  intake to a max of 4,000 mg in a 24 hour period. You can alternate these medications after surgery.   Diet: 1. Low sodium Heart Healthy Diet is recommended but you are cleared to resume your normal (before surgery) diet after your procedure.   2. It is safe to use a laxative, such as Miralax or Colace, if you have difficulty moving your bowels before surgery. You have been prescribed Sennakot-S to take at bedtime every evening after surgery to keep bowel movements regular and to prevent constipation.     Wound Care: 1. Keep clean and dry.  Shower daily.   Reasons to call the Doctor: Fever - Oral temperature greater than 100.4 degrees Fahrenheit Foul-smelling vaginal discharge Difficulty urinating Nausea and vomiting Increased pain at the site of the incision that is unrelieved with pain medicine. Difficulty breathing with or without chest pain New calf pain especially if only on one side Sudden, continuing increased vaginal bleeding with or without clots.   Contacts: For questions or concerns you should contact:   Dr. Comer Dollar at [phone]   Eleanor Epps, NP at [phone]   After Hours: call [phone] and have the GYN Oncologist paged/contacted (after 5 pm or on the weekends). You will speak  with an after hours RN and let he or she know you have had surgery.   Messages sent via mychart are for non-urgent matters and are not responded to after hours so for urgent needs, please call the after hours number.

## 2024-07-06 ENCOUNTER — Telehealth: Payer: Self-pay | Admitting: *Deleted

## 2024-07-06 NOTE — Telephone Encounter (Signed)
 Telephone call to check on pre-operative status.  Patient compliant with pre-operative instructions.  Reinforced nothing to eat after midnight. Clear liquids until 0415. Patient to arrive at 0515.  No questions or concerns voiced.  Instructed to call for any needs.

## 2024-07-06 NOTE — Telephone Encounter (Signed)
 Received PCP clearance.

## 2024-07-06 NOTE — Anesthesia Preprocedure Evaluation (Signed)
 Anesthesia Evaluation  Patient identified by MRN, date of birth, ID band Patient awake    Reviewed: Allergy & Precautions, NPO status , Patient's Chart, lab work & pertinent test results  Airway Mallampati: II  TM Distance: >3 FB Neck ROM: Full    Dental  (+) Poor Dentition   Pulmonary asthma , COPD, former smoker   Pulmonary exam normal        Cardiovascular hypertension,  Rhythm:Regular Rate:Normal     Neuro/Psych negative neurological ROS  negative psych ROS   GI/Hepatic negative GI ROS, Neg liver ROS,,,  Endo/Other  diabetes, Type 2, Oral Hypoglycemic Agents    Renal/GU negative Renal ROS  Female GU complaint Endometrial Ca    Musculoskeletal negative musculoskeletal ROS (+)    Abdominal Normal abdominal exam  (+)   Peds  Hematology Lab Results      Component                Value               Date                      WBC                      3.9 (L)             07/01/2024                HGB                      12.6                07/01/2024                HCT                      39.3                07/01/2024                MCV                      101.6 (H)           07/01/2024                PLT                      288                 07/01/2024             Lab Results      Component                Value               Date                      NA                       139                 07/01/2024                K  4.3                 07/01/2024                CO2                      26                  07/01/2024                GLUCOSE                  148 (H)             07/01/2024                BUN                      17                  07/01/2024                CREATININE               0.95                07/01/2024                CALCIUM                  9.8                 07/01/2024                GFRNONAA                 >60                 07/01/2024               Anesthesia Other Findings   Reproductive/Obstetrics                              Anesthesia Physical Anesthesia Plan  ASA: 3  Anesthesia Plan: General   Post-op Pain Management: Tylenol  PO (pre-op)* and Celebrex PO (pre-op)*   Induction: Intravenous  PONV Risk Score and Plan: 3 and Ondansetron , Dexamethasone and Treatment may vary due to age or medical condition  Airway Management Planned: Mask and Oral ETT  Additional Equipment: None  Intra-op Plan:   Post-operative Plan: Extubation in OR  Informed Consent: I have reviewed the patients History and Physical, chart, labs and discussed the procedure including the risks, benefits and alternatives for the proposed anesthesia with the patient or authorized representative who has indicated his/her understanding and acceptance.     Dental advisory given  Plan Discussed with: CRNA  Anesthesia Plan Comments:          Anesthesia Quick Evaluation

## 2024-07-06 NOTE — Telephone Encounter (Signed)
Attempted to reach patient for pre-op call. Left voicemail requesting call back to [phone].

## 2024-07-07 ENCOUNTER — Ambulatory Visit (HOSPITAL_COMMUNITY): Payer: Self-pay | Admitting: Anesthesiology

## 2024-07-07 ENCOUNTER — Other Ambulatory Visit: Payer: Self-pay

## 2024-07-07 ENCOUNTER — Ambulatory Visit (HOSPITAL_COMMUNITY)
Admission: RE | Admit: 2024-07-07 | Discharge: 2024-07-07 | Disposition: A | Discharge status: Home / Self Care | Attending: Gynecologic Oncology | Admitting: Gynecologic Oncology

## 2024-07-07 ENCOUNTER — Ambulatory Visit (HOSPITAL_COMMUNITY): Payer: Self-pay | Admitting: Physician Assistant

## 2024-07-07 ENCOUNTER — Encounter (HOSPITAL_COMMUNITY): Payer: Self-pay | Admitting: Gynecologic Oncology

## 2024-07-07 ENCOUNTER — Encounter (HOSPITAL_COMMUNITY)
Admission: RE | Disposition: A | Discharge status: Home / Self Care | Payer: Self-pay | Source: Home / Self Care | Attending: Gynecologic Oncology

## 2024-07-07 DIAGNOSIS — C541 Malignant neoplasm of endometrium: Secondary | ICD-10-CM

## 2024-07-07 DIAGNOSIS — Z87891 Personal history of nicotine dependence: Secondary | ICD-10-CM

## 2024-07-07 DIAGNOSIS — I10 Essential (primary) hypertension: Secondary | ICD-10-CM | POA: Diagnosis not present

## 2024-07-07 DIAGNOSIS — E119 Type 2 diabetes mellitus without complications: Secondary | ICD-10-CM

## 2024-07-07 DIAGNOSIS — J449 Chronic obstructive pulmonary disease, unspecified: Secondary | ICD-10-CM | POA: Diagnosis not present

## 2024-07-07 DIAGNOSIS — D259 Leiomyoma of uterus, unspecified: Secondary | ICD-10-CM | POA: Diagnosis not present

## 2024-07-07 HISTORY — PX: INJECTION, FOR SENTINEL LYMPH NODE IDENTIFICATION: SHX7598

## 2024-07-07 HISTORY — PX: ROBOTIC ASSISTED TOTAL HYSTERECTOMY WITH BILATERAL SALPINGO OOPHERECTOMY: SHX6086

## 2024-07-07 LAB — TYPE AND SCREEN
ABO/RH(D): AB POS
Antibody Screen: NEGATIVE

## 2024-07-07 LAB — GLUCOSE, CAPILLARY
Glucose-Capillary: 112 mg/dL — ABNORMAL HIGH (ref 70–99)
Glucose-Capillary: 185 mg/dL — ABNORMAL HIGH (ref 70–99)

## 2024-07-07 LAB — ABO/RH: ABO/RH(D): AB POS

## 2024-07-07 SURGERY — HYSTERECTOMY, TOTAL, ROBOT-ASSISTED, LAPAROSCOPIC, WITH BILATERAL SALPINGO-OOPHORECTOMY
Anesthesia: General

## 2024-07-07 MED ORDER — SUGAMMADEX SODIUM 200 MG/2ML IV SOLN
INTRAVENOUS | Status: DC | PRN
  Administered 2024-07-07: 250 mg via INTRAVENOUS

## 2024-07-07 MED ORDER — STERILE WATER FOR INJECTION IJ SOLN
INTRAMUSCULAR | Status: AC
  Filled 2024-07-07: qty 10

## 2024-07-07 MED ORDER — LABETALOL HCL 5 MG/ML IV SOLN
10.0000 mg | INTRAVENOUS | Status: DC | PRN
  Administered 2024-07-07: 10 mg via INTRAVENOUS

## 2024-07-07 MED ORDER — ROCURONIUM BROMIDE 100 MG/10ML IV SOLN
INTRAVENOUS | Status: DC | PRN
  Administered 2024-07-07: 60 mg via INTRAVENOUS
  Administered 2024-07-07: 20 mg via INTRAVENOUS

## 2024-07-07 MED ORDER — HYDRALAZINE HCL 20 MG/ML IJ SOLN
5.0000 mg | Freq: Four times a day (QID) | INTRAMUSCULAR | Status: DC | PRN
  Administered 2024-07-07: 5 mg via INTRAVENOUS

## 2024-07-07 MED ORDER — DEXAMETHASONE SOD PHOSPHATE PF 10 MG/ML IJ SOLN
4.0000 mg | INTRAMUSCULAR | Status: AC
  Administered 2024-07-07: 5 mg via INTRAVENOUS

## 2024-07-07 MED ORDER — ORAL CARE MOUTH RINSE: 15.0000 mL | Freq: Once | OROMUCOSAL | Status: AC

## 2024-07-07 MED ORDER — DROPERIDOL 2.5 MG/ML IJ SOLN
0.6250 mg | Freq: Once | INTRAMUSCULAR | Status: DC | PRN
Start: 2024-07-07 — End: 2024-07-07

## 2024-07-07 MED ORDER — ALBUTEROL SULFATE HFA 108 (90 BASE) MCG/ACT IN AERS
INHALATION_SPRAY | RESPIRATORY_TRACT | Status: DC | PRN
  Administered 2024-07-07: 4 via RESPIRATORY_TRACT

## 2024-07-07 MED ORDER — PHENYLEPHRINE HCL (PRESSORS) 10 MG/ML IV SOLN
INTRAVENOUS | Status: AC
  Filled 2024-07-07: qty 1

## 2024-07-07 MED ORDER — STERILE WATER FOR IRRIGATION IR SOLN
Status: DC | PRN
  Administered 2024-07-07: 1000 mL

## 2024-07-07 MED ORDER — HEPARIN SODIUM (PORCINE) 5000 UNIT/ML IJ SOLN
5000.0000 [IU] | INTRAMUSCULAR | Status: AC
  Administered 2024-07-07: 5000 [IU] via SUBCUTANEOUS
  Filled 2024-07-07: qty 1

## 2024-07-07 MED ORDER — CEFAZOLIN SODIUM-DEXTROSE 2-4 GM/100ML-% IV SOLN
2.0000 g | INTRAVENOUS | Status: AC
  Administered 2024-07-07: 2 g via INTRAVENOUS
  Filled 2024-07-07: qty 100

## 2024-07-07 MED ORDER — BUPIVACAINE HCL (PF) 0.25 % IJ SOLN
INTRAMUSCULAR | Status: AC
  Filled 2024-07-07: qty 30

## 2024-07-07 MED ORDER — ACETAMINOPHEN 500 MG PO TABS: 1000.0000 mg | ORAL_TABLET | Freq: Once | ORAL | Status: DC

## 2024-07-07 MED ORDER — PHENYLEPHRINE HCL-NACL 20-0.9 MG/250ML-% IV SOLN
INTRAVENOUS | Status: AC
Start: 2024-07-07 — End: 2024-07-07
  Filled 2024-07-07: qty 250

## 2024-07-07 MED ORDER — OXYCODONE HCL 5 MG/5ML PO SOLN: 5.0000 mg | Freq: Once | ORAL | Status: DC | PRN

## 2024-07-07 MED ORDER — PROPOFOL 10 MG/ML IV BOLUS
INTRAVENOUS | Status: AC
  Filled 2024-07-07: qty 20

## 2024-07-07 MED ORDER — LACTATED RINGERS IV SOLN: INTRAVENOUS | Status: DC

## 2024-07-07 MED ORDER — STERILE WATER FOR INJECTION IJ SOLN
INTRAMUSCULAR | Status: DC | PRN
  Administered 2024-07-07: 10 mL

## 2024-07-07 MED ORDER — LIDOCAINE HCL (CARDIAC) PF 100 MG/5ML IV SOSY
PREFILLED_SYRINGE | INTRAVENOUS | Status: DC | PRN
  Administered 2024-07-07: 80 mg via INTRAVENOUS

## 2024-07-07 MED ORDER — LACTATED RINGERS IR SOLN
Status: DC | PRN
  Administered 2024-07-07: 1000 mL

## 2024-07-07 MED ORDER — MIDAZOLAM HCL 2 MG/2ML IJ SOLN
INTRAMUSCULAR | Status: AC
  Filled 2024-07-07: qty 2

## 2024-07-07 MED ORDER — PROPOFOL 10 MG/ML IV BOLUS
INTRAVENOUS | Status: AC
Start: 2024-07-07 — End: 2024-07-07
  Filled 2024-07-07: qty 20

## 2024-07-07 MED ORDER — HYDRALAZINE HCL 20 MG/ML IJ SOLN
INTRAMUSCULAR | Status: AC
  Filled 2024-07-07: qty 1

## 2024-07-07 MED ORDER — PROPOFOL 10 MG/ML IV BOLUS
INTRAVENOUS | Status: DC | PRN
  Administered 2024-07-07: 150 mg via INTRAVENOUS

## 2024-07-07 MED ORDER — HYDROMORPHONE HCL 1 MG/ML IJ SOLN
INTRAMUSCULAR | Status: AC
  Filled 2024-07-07: qty 1

## 2024-07-07 MED ORDER — OXYCODONE HCL 5 MG PO TABS: 5.0000 mg | ORAL_TABLET | Freq: Once | ORAL | Status: DC | PRN

## 2024-07-07 MED ORDER — MIDAZOLAM HCL 5 MG/5ML IJ SOLN
INTRAMUSCULAR | Status: DC | PRN
  Administered 2024-07-07: 2 mg via INTRAVENOUS

## 2024-07-07 MED ORDER — FENTANYL CITRATE (PF) 50 MCG/ML IJ SOSY
PREFILLED_SYRINGE | INTRAMUSCULAR | Status: AC
  Filled 2024-07-07: qty 2

## 2024-07-07 MED ORDER — BUPIVACAINE HCL 0.25 % IJ SOLN
INTRAMUSCULAR | Status: DC | PRN
  Administered 2024-07-07: 24 mL

## 2024-07-07 MED ORDER — PHENYLEPHRINE HCL-NACL 20-0.9 MG/250ML-% IV SOLN
INTRAVENOUS | Status: DC | PRN
  Administered 2024-07-07: 25 ug/min via INTRAVENOUS

## 2024-07-07 MED ORDER — ONDANSETRON HCL 4 MG/2ML IJ SOLN
INTRAMUSCULAR | Status: DC | PRN
  Administered 2024-07-07: 4 mg via INTRAVENOUS

## 2024-07-07 MED ORDER — HYDROMORPHONE HCL 1 MG/ML IJ SOLN
0.2500 mg | INTRAMUSCULAR | Status: DC | PRN
  Administered 2024-07-07 (×2): 0.5 mg via INTRAVENOUS

## 2024-07-07 MED ORDER — KETAMINE HCL 50 MG/5ML IJ SOSY
PREFILLED_SYRINGE | INTRAMUSCULAR | Status: AC
  Filled 2024-07-07: qty 5

## 2024-07-07 MED ORDER — LABETALOL HCL 5 MG/ML IV SOLN
INTRAVENOUS | Status: AC
  Filled 2024-07-07: qty 4

## 2024-07-07 MED ORDER — LACTATED RINGERS IV SOLN
INTRAVENOUS | Status: DC | PRN
Start: 2024-07-07 — End: 2024-07-07

## 2024-07-07 MED ORDER — FENTANYL CITRATE (PF) 250 MCG/5ML IJ SOLN
INTRAMUSCULAR | Status: AC
  Filled 2024-07-07: qty 5

## 2024-07-07 MED ORDER — KETAMINE HCL 50 MG/5ML IJ SOSY
PREFILLED_SYRINGE | INTRAMUSCULAR | Status: DC | PRN
  Administered 2024-07-07: 10 mg via INTRAVENOUS
  Administered 2024-07-07: 20 mg via INTRAVENOUS

## 2024-07-07 MED ORDER — HEMOSTATIC AGENTS (NO CHARGE) OPTIME
TOPICAL | Status: DC | PRN
  Administered 2024-07-07: 1 via TOPICAL

## 2024-07-07 MED ORDER — CHLORHEXIDINE GLUCONATE 0.12 % MT SOLN
15.0000 mL | Freq: Once | OROMUCOSAL | Status: AC
  Administered 2024-07-07: 15 mL via OROMUCOSAL

## 2024-07-07 MED ORDER — CELECOXIB 200 MG PO CAPS
200.0000 mg | ORAL_CAPSULE | Freq: Once | ORAL | Status: AC
  Administered 2024-07-07: 200 mg via ORAL
  Filled 2024-07-07: qty 1

## 2024-07-07 MED ORDER — FENTANYL CITRATE (PF) 100 MCG/2ML IJ SOLN
INTRAMUSCULAR | Status: DC | PRN
  Administered 2024-07-07 (×3): 50 ug via INTRAVENOUS
  Administered 2024-07-07: 100 ug via INTRAVENOUS

## 2024-07-07 MED ORDER — INSULIN ASPART 100 UNIT/ML IJ SOLN: 0.0000 [IU] | INTRAMUSCULAR | Status: DC | PRN

## 2024-07-07 MED ORDER — METRONIDAZOLE 500 MG/100ML IV SOLN
500.0000 mg | INTRAVENOUS | Status: AC
  Administered 2024-07-07: 500 mg via INTRAVENOUS
  Filled 2024-07-07: qty 100

## 2024-07-07 MED ORDER — FENTANYL CITRATE (PF) 50 MCG/ML IJ SOSY
25.0000 ug | PREFILLED_SYRINGE | INTRAMUSCULAR | Status: DC | PRN
  Administered 2024-07-07 (×3): 50 ug via INTRAVENOUS

## 2024-07-07 MED ORDER — FENTANYL CITRATE (PF) 50 MCG/ML IJ SOSY
PREFILLED_SYRINGE | INTRAMUSCULAR | Status: AC
  Filled 2024-07-07: qty 1

## 2024-07-07 MED ORDER — ACETAMINOPHEN 500 MG PO TABS
1000.0000 mg | ORAL_TABLET | ORAL | Status: AC
  Administered 2024-07-07: 1000 mg via ORAL
  Filled 2024-07-07: qty 2

## 2024-07-07 SURGICAL SUPPLY — 75 items
APPLICATOR SURGIFLO ENDO (HEMOSTASIS) IMPLANT
BAG COUNTER SPONGE SURGICOUNT (BAG) IMPLANT
BAG LAPAROSCOPIC 12 15 PORT 16 (BASKET) IMPLANT
BLADE SURG SZ10 CARB STEEL (BLADE) IMPLANT
CATH FOLEY 2WAY SLVR 5CC 14FR (CATHETERS) IMPLANT
COVER BACK TABLE 60X90IN (DRAPES) ×2 IMPLANT
COVER TIP SHEARS 8 DVNC (MISCELLANEOUS) ×2 IMPLANT
DERMABOND ADVANCED .7 DNX12 (GAUZE/BANDAGES/DRESSINGS) ×2 IMPLANT
DRAPE ARM DVNC X/XI (DISPOSABLE) ×8 IMPLANT
DRAPE COLUMN DVNC XI (DISPOSABLE) ×2 IMPLANT
DRAPE SHEET LG 3/4 BI-LAMINATE (DRAPES) ×2 IMPLANT
DRAPE SURG IRRIG POUCH 19X23 (DRAPES) ×2 IMPLANT
DRIVER NDL MEGA SUTCUT DVNCXI (INSTRUMENTS) ×2 IMPLANT
DRIVER NDLE MEGA SUTCUT DVNCXI (INSTRUMENTS) ×2 IMPLANT
DRSG OPSITE POSTOP 4X6 (GAUZE/BANDAGES/DRESSINGS) IMPLANT
DRSG OPSITE POSTOP 4X8 (GAUZE/BANDAGES/DRESSINGS) IMPLANT
ELECT PENCIL ROCKER SW 15FT (MISCELLANEOUS) IMPLANT
ELECT REM PT RETURN 15FT ADLT (MISCELLANEOUS) ×2 IMPLANT
FORCEPS BPLR FENES DVNC XI (FORCEP) ×2 IMPLANT
FORCEPS PROGRASP DVNC XI (FORCEP) ×2 IMPLANT
GAUZE 4X4 16PLY ~~LOC~~+RFID DBL (SPONGE) ×4 IMPLANT
GLOVE BIO SURGEON STRL SZ 6 (GLOVE) ×8 IMPLANT
GLOVE BIO SURGEON STRL SZ 6.5 (GLOVE) ×2 IMPLANT
GOWN STRL REUS W/ TWL LRG LVL3 (GOWN DISPOSABLE) ×8 IMPLANT
GRASPER SUT TROCAR 14GX15 (MISCELLANEOUS) IMPLANT
HOLDER FOLEY CATH W/STRAP (MISCELLANEOUS) IMPLANT
IRRIGATION SUCT STRKRFLW 2 WTP (MISCELLANEOUS) ×2 IMPLANT
KIT PROCEDURE DVNC SI (MISCELLANEOUS) IMPLANT
KIT TURNOVER KIT A (KITS) ×2 IMPLANT
LIGASURE IMPACT 36 18CM CVD LR (INSTRUMENTS) IMPLANT
MANIPULATOR ADVINCU DEL 3.0 PL (MISCELLANEOUS) IMPLANT
MANIPULATOR ADVINCU DEL 3.5 PL (MISCELLANEOUS) IMPLANT
MANIPULATOR UTERINE 4.5 ZUMI (MISCELLANEOUS) IMPLANT
NDL HYPO 21X1.5 SAFETY (NEEDLE) ×2 IMPLANT
NDL SPNL 18GX3.5 QUINCKE PK (NEEDLE) IMPLANT
NEEDLE HYPO 21X1.5 SAFETY (NEEDLE) ×2 IMPLANT
NEEDLE SPNL 18GX3.5 QUINCKE PK (NEEDLE) ×2 IMPLANT
OBTURATOR OPTICALSTD 8 DVNC (TROCAR) ×2 IMPLANT
PACK ROBOT GYN CUSTOM WL (TRAY / TRAY PROCEDURE) ×2 IMPLANT
PACKING VAGINAL (PACKING) IMPLANT
PAD POSITIONING PINK XL (MISCELLANEOUS) ×2 IMPLANT
PORT ACCESS TROCAR AIRSEAL 12 (TROCAR) IMPLANT
POWDER SURGICEL 3.0 GRAM (HEMOSTASIS) IMPLANT
SCISSORS LAP 5X45 EPIX DISP (ENDOMECHANICALS) IMPLANT
SCISSORS MNPLR CVD DVNC XI (INSTRUMENTS) ×2 IMPLANT
SCRUB CHG 4% DYNA-HEX 4OZ (MISCELLANEOUS) IMPLANT
SEAL UNIV 5-12 XI (MISCELLANEOUS) ×8 IMPLANT
SET TRI-LUMEN FLTR TB AIRSEAL (TUBING) ×2 IMPLANT
SPIKE FLUID TRANSFER (MISCELLANEOUS) ×2 IMPLANT
SPONGE T-LAP 18X18 ~~LOC~~+RFID (SPONGE) IMPLANT
SURGIFLO W/THROMBIN 8M KIT (HEMOSTASIS) IMPLANT
SUT MNCRL AB 4-0 PS2 18 (SUTURE) IMPLANT
SUT PDS AB 1 TP1 96 (SUTURE) IMPLANT
SUT STRATA PDS 0 30 CT-2.5 (SUTURE) IMPLANT
SUT V-LOC 180 0-0 GS22 (SUTURE) IMPLANT
SUT VIC AB 0 CT1 27XBRD ANTBC (SUTURE) IMPLANT
SUT VIC AB 2-0 CT1 TAPERPNT 27 (SUTURE) IMPLANT
SUT VIC AB 2-0 SH 27X BRD (SUTURE) IMPLANT
SUT VIC AB 2-0 SH 27XBRD (SUTURE) IMPLANT
SUT VIC AB 4-0 PS2 18 (SUTURE) ×4 IMPLANT
SUT VICRYL 0 27 CT2 27 ABS (SUTURE) ×2 IMPLANT
SUT VLOC 180 0 9IN GS21 (SUTURE) IMPLANT
SUTURE STRATFX 0 PDS+ CT-2 23 (SUTURE) IMPLANT
SYR 10ML LL (SYRINGE) IMPLANT
SYSTEM BAG RETRIEVAL 10MM (BASKET) IMPLANT
SYSTEM RETRIEVL 5MM INZII UNIV (BASKET) IMPLANT
SYSTEM WOUND ALEXIS 18CM MED (MISCELLANEOUS) IMPLANT
TIP ENDOSCOPIC SURGICEL (TIP) IMPLANT
TRAP SPECIMEN MUCUS 40CC (MISCELLANEOUS) IMPLANT
TRAY FOLEY MTR SLVR 16FR STAT (SET/KITS/TRAYS/PACK) ×2 IMPLANT
TROCAR PORT AIRSEAL 5X120 (TROCAR) IMPLANT
TROCAR XCEL NON-BLD 5MMX100MML (ENDOMECHANICALS) ×2 IMPLANT
UNDERPAD 30X36 HEAVY ABSORB (UNDERPADS AND DIAPERS) ×4 IMPLANT
WATER STERILE IRR 1000ML POUR (IV SOLUTION) ×2 IMPLANT
YANKAUER SUCT BULB TIP 10FT TU (MISCELLANEOUS) IMPLANT

## 2024-07-07 NOTE — Transfer of Care (Signed)
 Immediate Anesthesia Transfer of Care Note  Patient: Katelyn Winters  Procedure(s) Performed: HYSTERECTOMY, TOTAL, ROBOT-ASSISTED, LAPAROSCOPIC, WITH BILATERAL SALPINGO-OOPHORECTOMY (Bilateral) INJECTION, FOR SENTINEL LYMPH NODE IDENTIFICATION LYMPH NODE BIOPSY LAPAROSCOPIC TOTAL PELVIC LYMPHADENECTOMY  Patient Location: PACU  Anesthesia Type:General  Level of Consciousness: awake and alert   Airway & Oxygen Therapy: Patient Spontanous Breathing and Patient connected to face mask oxygen  Post-op Assessment: Report given to RN and Post -op Vital signs reviewed and stable  Post vital signs: Reviewed and stable  Last Vitals:  Vitals Value Taken Time  BP 194/97 07/07/24 10:51  Temp    Pulse 78 07/07/24 10:53  Resp 16 07/07/24 10:53  SpO2 98 % 07/07/24 10:53  Vitals shown include unfiled device data.  Last Pain:  Vitals:   07/07/24 9391  TempSrc: Oral  PainSc:       Patients Stated Pain Goal: 4 (07/07/24 0557)  Complications: No notable events documented.

## 2024-07-07 NOTE — Op Note (Signed)
 OPERATIVE NOTE  Pre-operative Diagnosis: endometrial cancer grade 2  Post-operative Diagnosis: same  Operation: Robotic-assisted laparoscopic total hysterectomy with bilateral salpingoophorectomy, SLN biopsy bilaterally, repair of vaginal lacerations  Surgeon: Viktoria Crank MD  Assistant Surgeon: Eleanor Epps, NP (an NP assistant was necessary for tissue manipulation, management of robotic instrumentation, retraction and positioning due to the complexity of the case and hospital policies).   Anesthesia: GET  Urine Output: 150 cc  Operative Findings: ON EUA, narrow vagina, small mobile uterus.  On intra-abdominal entry, normal upper abdominal survey.  Some omentum adherent to the anterior abdominal wall at the level of the falciform ligament with the transverse colon pulled up some.  No other intra-abdominal adhesions noted.  Normal-appearing small bowel, large bowel.  Uterus 6-8 cm and normal in appearance.  Normal bilateral tubes and ovaries.  Significantly engorged ovarian veins and uterine veins.  Mapping successful to bilateral obturator sentinel lymph nodes, no adenopathy. Proximal vaginal sidewall tears noted as well as a more distal right sidewall tear due to very narrow vagina and delivery of the uterine specimen through the vagina. On cystoscopy, bladder dome intact, good efflux from bilateral ureteral orifices.  Estimated Blood Loss:  100 cc      Total IV Fluids: see I&O flowsheet         Specimens: uterus, cervix, bilateral tubes and ovaries, bilateral obturator SLNs         Complications:  None apparent; patient tolerated the procedure well.         Disposition: PACU - hemodynamically stable.  Procedure Details  The patient was seen in the Holding Room. The risks, benefits, complications, treatment options, and expected outcomes were discussed with the patient.  The patient concurred with the proposed plan, giving informed consent.  The site of surgery properly  noted/marked. The patient was identified as Katelyn Winters and the procedure verified as a Robotic-assisted hysterectomy with bilateral salpingo oophorectomy with SLN biopsy.   After induction of anesthesia, the patient was draped and prepped in the usual sterile manner. Patient was placed in supine position after anesthesia and draped and prepped in the usual sterile manner as follows: Her arms were tucked to her side with all appropriate precautions.  The patient was secured to the bed using padding and tape across her chest.  The patient was placed in the semi-lithotomy position in Crimora stirrups.  The perineum and vagina were prepped with CHG. The patient's abdomen was prepped with ChloraPrep and she was draped after the prep had been allowed to dry for 3 minutes.  A Time Out was held and the above information confirmed.  The urethra was prepped with Betadine. Foley catheter was placed.  A sterile speculum was placed in the vagina.  The cervix was grasped with a single-tooth tenaculum. 2mg  total of ICG was injected into the cervical stroma at 2 and 9 o'clock with 1cc injected at a 1cm and 2mm depth (concentration 0.5mg /ml) in all locations. The cervix was dilated with Fredirick dilators.  The ZUMI uterine manipulator without a colpotomizer ring was placed without difficulty.  A pneum occluder balloon was placed over the manipulator.  OG tube placement was confirmed and to suction.   Next, a 10 mm skin incision was made 1 cm below the subcostal margin in the midclavicular line.  The 5 mm Optiview port and scope was used for direct entry.  Opening pressure was under 10 mm CO2.  The abdomen was insufflated and the findings were noted as above.  At this point and all points during the procedure, the patient's intra-abdominal pressure did not exceed 15 mmHg. Next, an 8 mm skin incision was made superior to the umbilicus and a right and left port were placed about 8 cm lateral to the robot port on the right and  left side.  A fourth arm was placed on the right.  The 5 mm assist trocar was exchanged for a 10-12 mm port. All ports were placed under direct visualization.  The patient was placed in steep Trendelenburg.  Bowel was folded away into the upper abdomen.  The robot was docked in the normal manner.  The right and left peritoneum were opened parallel to the IP ligament to open the retroperitoneal spaces bilaterally. The round ligaments were transected. The SLN mapping was performed in bilateral pelvic basins. After identifying the ureters, the para rectal and paravesical spaces were opened up entirely with careful dissection below the level of the ureters bilaterally and to the depth of the uterine artery origin in order to skeletonize the uterine web and ensure visualization of all parametrial channels. The para-aortic basins were carefully exposed and evaluated for isolated para-aortic SLN's. Lymphatic channels were identified travelling to the following visualized sentinel lymph node's: bilateral obturator SLNs. These SLN's were separated from their surrounding lymphatic tissue, removed and sent for permanent pathology.  The hysterectomy was started.  At this point, the ZUMI manipulator was removed and a small EEA sizer was placed in the vagina to delineate the cervicovaginal junction.  The ureter was again noted to be on the medial leaf of the broad ligament.  The peritoneum above the ureter was incised and stretched and the infundibulopelvic ligament was skeletonized, cauterized and cut.  The posterior peritoneum was taken down to the level of the KOH ring.  The anterior peritoneum was also taken down.  The bladder flap was created to the level of the KOH ring.  The uterine artery on the right side was skeletonized, cauterized and cut in the normal manner.  Significantly engorged uterine veins were noted and cauterized and transected in a similar manner.  A similar procedure was performed on the left.  The  colpotomy was made and the uterus, cervix, bilateral ovaries and tubes were amputated, placed in a 15 mm Endo Catch bag, and delivered through the vagina.    Bleeding was noted from proximal to her bilaterally along the vaginal sidewall.  These were made hemostatic and reapproximated with 2-0 Vicryl in running fashion.  Pedicles were inspected and excellent hemostasis was achieved.    The colpotomy at the vaginal cuff was closed with 0 Vicryl with a figure-of-eight at each apex and 0 strata fix to close the midportion of the cuff in a running manner.  Oozing noted from the right aspect of the bladder dome.  Figure-of-eight with 2-0 Vicryl was used to achieve hemostasis.  Irrigation was used and excellent hemostasis was achieved.  Intra-abdominal pressure was decreased to 5 mmHg with good hemostasis appreciated.  Surgicel powder was placed over the surgical bed.  At this point in the procedure was completed.  Robotic instruments were removed under direct visulaization.  The robot was undocked.   With the patient still in steep Trendelenburg, the bladder was backfilled with 200 cc of sterile fluid.  Foley catheter removed and cystoscopy was performed with findings noted above.  The fascia at the 12 mm port was closed with 0 Vicryl using a PMI fascial closure device under direct visualization.  The subcuticular  tissue was closed with 4-0 Vicryl and the skin was closed with 4-0 Monocryl in a subcuticular manner.  Dermabond was applied.    The vagina was swabbed with some bleeding noted.  On speculum exam, area along the left mid vaginal sidewall was noted to be bleeding.  2 Vicryl was used in running fashion to achieve hemostasis.  The vagina was then packed with moistened vaginal packing.  Monopolar electrocautery was used to achieve hemostasis along the distal posterior vagina.  Given placement of packing, Foley catheter was reinserted.  All sponge, lap and needle counts were correct x  3.   The patient  was transferred to the recovery room in stable condition.  Comer Dollar, MD

## 2024-07-07 NOTE — Anesthesia Postprocedure Evaluation (Signed)
 Anesthesia Post Note  Patient: Trenity Pha  Procedure(s) Performed: HYSTERECTOMY, TOTAL, ROBOT-ASSISTED, LAPAROSCOPIC, WITH BILATERAL SALPINGO-OOPHORECTOMY (Bilateral) INJECTION, FOR SENTINEL LYMPH NODE IDENTIFICATION     Patient location during evaluation: PACU Anesthesia Type: General Level of consciousness: awake and alert Pain management: pain level controlled Vital Signs Assessment: post-procedure vital signs reviewed and stable Respiratory status: spontaneous breathing, nonlabored ventilation, respiratory function stable and patient connected to nasal cannula oxygen Cardiovascular status: blood pressure returned to baseline and stable Postop Assessment: no apparent nausea or vomiting Anesthetic complications: no   No notable events documented.  Last Vitals:  Vitals:   07/07/24 1530 07/07/24 1545  BP: (!) 160/76 (!) 160/76  Pulse: 75 75  Resp: 12 12  Temp: 36.5 C 36.5 C  SpO2: 99% 99%    Last Pain:  Vitals:   07/07/24 1545  TempSrc:   PainSc: 0-No pain                 Cordella P Guliana Weyandt

## 2024-07-07 NOTE — Anesthesia Procedure Notes (Signed)
 Procedure Name: Intubation Date/Time: 07/07/2024 7:49 AM  Performed by: Dartha Meckel, CRNAPre-anesthesia Checklist: Patient identified, Emergency Drugs available, Suction available and Patient being monitored Patient Re-evaluated:Patient Re-evaluated prior to induction Oxygen Delivery Method: Circle system utilized Preoxygenation: Pre-oxygenation with 100% oxygen Induction Type: IV induction Ventilation: Mask ventilation without difficulty Laryngoscope Size: Mac and 3 Grade View: Grade I Tube type: Oral Tube size: 7.0 mm Number of attempts: 1 Airway Equipment and Method: Stylet and Oral airway Placement Confirmation: ETT inserted through vocal cords under direct vision, positive ETCO2 and breath sounds checked- equal and bilateral Secured at: 21 cm Tube secured with: Tape Dental Injury: Teeth and Oropharynx as per pre-operative assessment

## 2024-07-07 NOTE — Interval H&P Note (Signed)
 History and Physical Interval Note:  07/07/2024 6:57 AM  Katelyn Winters  has presented today for surgery, with the diagnosis of Endometrial cancer.  The various methods of treatment have been discussed with the patient and family. After consideration of risks, benefits and other options for treatment, the patient has consented to  Procedure(s) with comments: HYSTERECTOMY, TOTAL, ROBOT-ASSISTED, LAPAROSCOPIC, WITH BILATERAL SALPINGO-OOPHORECTOMY (Bilateral) - POSSIBLE LAPAROTOMY INJECTION, FOR SENTINEL LYMPH NODE IDENTIFICATION (N/A) LYMPH NODE BIOPSY (N/A) LAPAROSCOPIC TOTAL PELVIC LYMPHADENECTOMY (N/A) as a surgical intervention.  The patient's history has been reviewed, patient examined, no change in status, stable for surgery.  I have reviewed the patient's chart and labs.  Questions were answered to the patient's satisfaction.     Comer JONELLE Dollar

## 2024-07-08 ENCOUNTER — Telehealth: Payer: Self-pay | Admitting: *Deleted

## 2024-07-08 ENCOUNTER — Encounter (HOSPITAL_COMMUNITY): Payer: Self-pay | Admitting: Gynecologic Oncology

## 2024-07-08 NOTE — Telephone Encounter (Signed)
 2nd attempt to reach  patient for post op call. Left voicemail requesting call back to [phone].

## 2024-07-08 NOTE — Telephone Encounter (Signed)
 Attempted to reach patient for post op call. Unable to leave a voicemail at this time.

## 2024-07-08 NOTE — Telephone Encounter (Signed)
 3rd attempt to reach patient for post op call. Left voicemail requesting call back to [phone].

## 2024-07-08 NOTE — Telephone Encounter (Signed)
 Spoke with Ms. Weckerly's brother who states patient is resting at this time.  She is eating, drinking and urinating well. She has not had a BM yet but is passing gas. She is taking senokot as prescribed and encouraged her to drink plenty of water. She denies fever or chills. Incisions are dry and intact. States her pain is controlled with tramadol. And she is ambulating and going to the bathroom often and denies vaginal bleeding.    Instructed to call office with any fever, chills, purulent drainage, uncontrolled pain or any other questions or concerns. Patient's brother verbalizes understanding.   Pt aware of post op appointments as well as the office number [phone] and after hours number [phone] to call if she has any questions or concerns

## 2024-07-11 ENCOUNTER — Ambulatory Visit: Payer: Self-pay | Admitting: Gynecologic Oncology

## 2024-07-12 ENCOUNTER — Encounter: Payer: Self-pay | Admitting: Oncology

## 2024-07-12 LAB — SURGICAL PATHOLOGY

## 2024-07-12 NOTE — Progress Notes (Signed)
 Requested MSI and MLH1 promoter hypermethylation on accession [id number] with [facility].

## 2024-07-21 ENCOUNTER — Encounter: Payer: Self-pay | Admitting: Genetic Counselor

## 2024-07-21 ENCOUNTER — Inpatient Hospital Stay: Attending: Genetic Counselor | Admitting: Genetic Counselor

## 2024-07-21 ENCOUNTER — Inpatient Hospital Stay

## 2024-07-21 ENCOUNTER — Other Ambulatory Visit: Payer: Self-pay | Admitting: Genetic Counselor

## 2024-07-21 DIAGNOSIS — Z1379 Encounter for other screening for genetic and chromosomal anomalies: Secondary | ICD-10-CM | POA: Diagnosis present

## 2024-07-21 DIAGNOSIS — C541 Malignant neoplasm of endometrium: Secondary | ICD-10-CM

## 2024-07-21 DIAGNOSIS — Z803 Family history of malignant neoplasm of breast: Secondary | ICD-10-CM | POA: Diagnosis present

## 2024-07-21 LAB — GENETIC SCREENING ORDER

## 2024-07-21 NOTE — Progress Notes (Signed)
 REFERRING PROVIDER: Cleatus Moccasin, MD [address] [place],  [place] [address]  PRIMARY PROVIDER:  Allen Lauraine CROME, PA-C  PRIMARY REASON FOR VISIT:  1. Family history of breast cancer   2. Endometrial cancer (HCC)      HISTORY OF PRESENT ILLNESS:   Ms. Schey, a 68 y.o. female, was seen for a [facility] cancer genetics using a sign language interpreter consultation at the request of Dr. Cleatus due to a personal and family history of cancer.  Ms. Sherrard presents to clinic today to discuss the possibility of a hereditary predisposition to cancer, genetic testing, and to further clarify her future cancer risks, as well as potential cancer risks for family members.   In 2025, at the age of 40, Ms. Tallon was diagnosed with endometrial cancer. The treatment plan included a hysterectomy removing the uterine, tubes and ovaries.  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     CANCER HISTORY:  Oncology History   No history exists.     RISK FACTORS:  Menarche was at age 77.  First live birth at age N/A.   Ovaries intact: no.  Hysterectomy: yes.  Menopausal status: postmenopausal.  HRT use: 0 years. Colonoscopy: yes; normal. Mammogram within the last year: yes. Number of breast biopsies: 0. Up to date with pelvic exams: yes. Any excessive radiation exposure in the past: no  Past Medical History:  Diagnosis Date   Asthma    COPD (chronic obstructive pulmonary disease) (HCC)    Deaf    Diabetes mellitus without complication (HCC)    Endometrial cancer (HCC)    Family history of breast cancer    Hypertension     Past Surgical History:  Procedure Laterality Date   BLADDER SURGERY     COLONOSCOPY     INJECTION, FOR SENTINEL LYMPH NODE IDENTIFICATION N/A 07/07/2024   Procedure: INJECTION, FOR SENTINEL LYMPH NODE IDENTIFICATION;  Surgeon: Viktoria Comer SAUNDERS, MD;  Location: [facility];  Service: Gynecology;  Laterality: N/A;   ROBOTIC ASSISTED TOTAL HYSTERECTOMY WITH BILATERAL SALPINGO  OOPHERECTOMY Bilateral 07/07/2024   Procedure: HYSTERECTOMY, TOTAL, ROBOT-ASSISTED, LAPAROSCOPIC, WITH BILATERAL SALPINGO-OOPHORECTOMY;  Surgeon: Viktoria Comer SAUNDERS, MD;  Location: [facility];  Service: Gynecology;  Laterality: Bilateral;  POSSIBLE LAPAROTOMY    Social History   Socioeconomic History   Marital status: Single    Spouse name: Not on file   Number of children: Not on file   Years of education: Not on file   Highest education level: Not on file  Occupational History   Not on file  Tobacco Use   Smoking status: Former    Current packs/day: 0.00    Types: Cigarettes    Start date: 11/30/1986    Quit date: 11/29/2021    Years since quitting: 2.6   Smokeless tobacco: Never  Vaping Use   Vaping status: Never Used  Substance and Sexual Activity   Alcohol  use: Not Currently   Drug use: No   Sexual activity: Not Currently  Other Topics Concern   Not on file  Social History Narrative   Not on file   Social Drivers of Health   Financial Resource Strain: Low Risk  (03/19/2021)   Received from [facility]   Overall Financial Resource Strain (CARDIA)    Difficulty of Paying Living Expenses: Not very hard  Food Insecurity: No Food Insecurity (11/12/2021)   Received from [facility]   Hunger Vital Sign    Within the past 12 months, you worried that your food  would run out before you got the money to buy more.: Never true    Within the past 12 months, the food you bought just didn't last and you didn't have money to get more.: Never true  Transportation Needs: No Transportation Needs (03/19/2021)   Received from [facility]   PRAPARE - Transportation    Lack of Transportation (Medical): No    Lack of Transportation (Non-Medical): No  Physical Activity: Sufficiently Active (03/19/2021)   Received from [facility]   Exercise Vital Sign    On average, how many days per week do you engage in moderate to strenuous exercise (like a brisk walk)?: 7 days    On average, how  many minutes do you engage in exercise at this level?: 70 min  Stress: No Stress Concern Present (03/19/2021)   Received from [facility] - Occupational Stress Questionnaire    Feeling of Stress : Not at all  Social Connections: Moderately Isolated (03/19/2021)   Received from [facility]   Social Connection and Isolation Panel    In a typical week, how many times do you talk on the phone with family, friends, or neighbors?: More than three times a week    How often do you get together with friends or relatives?: More than three times a week    How often do you attend church or religious services?: Never    Do you belong to any clubs or organizations such as church groups, unions, fraternal or athletic groups, or school groups?: No    How often do you attend meetings of the clubs or organizations you belong to?: Never    Are you married, widowed, divorced, separated, never married, or living with a partner?: Married     FAMILY HISTORY:  We obtained a detailed, 4-generation family history.  Significant diagnoses are listed below: Family History  Problem Relation Age of Onset   Breast cancer Mother 35   Heart attack Father    Blindness Maternal Uncle    Heart attack Paternal Uncle    Breast cancer Maternal Grandmother 78   Heart attack Maternal Grandfather      The patient does not have children.  She has a brother who is cancer free.  Both parents are deceased.  The significant family history of cancer include: The patient's mother with breast cancer at 56 The patient's maternal grandmother with breast cancer at 51.  Ms. Candy is unaware of previous family history of genetic testing for hereditary cancer risks.  There is no reported Ashkenazi Jewish ancestry. There is no known consanguinity.  GENETIC COUNSELING ASSESSMENT: Ms. Fuente is a 68 y.o. female with a personal and family history of cancer which is somewhat suggestive of Lynch  syndrome and predisposition to cancer given the loss of PMS2 in the tumor. We, therefore, discussed and recommended the following at today's visit.   DISCUSSION: We discussed that, in general, most cancer is not inherited in families, but instead is sporadic or familial. Sporadic cancers occur by chance and typically happen at older ages (>50 years) as this type of cancer is caused by genetic changes acquired during an individual's lifetime. Some families have more cancers than would be expected by chance; however, the ages or types of cancer are not consistent with a known genetic mutation or known genetic mutations have been ruled out. This type of familial cancer is thought to be due to a combination of multiple genetic, environmental, hormonal, and lifestyle  factors. While this combination of factors likely increases the risk of cancer, the exact source of this risk is not currently identifiable or testable.  We discussed that 3 - 5% of uterine cancer is hereditary, with most cases associated with Lynch syndrome.  There are other genes that can be associated with hereditary uterine cancer syndromes.  These include PTEN.  Based on the IHC testing performed on the patient's uterine tumor, she is at risk for Lynch syndrome due to loss of IHC in PMS2  We discussed that testing is beneficial for several reasons including knowing how to follow individuals after completing their treatment, and understand if other family members could be at risk for cancer and allow them to undergo genetic testing.   We reviewed the characteristics, features and inheritance patterns of hereditary cancer syndromes. We also discussed genetic testing, including the appropriate family members to test, the process of testing, insurance coverage and turn-around-time for results. We discussed the implications of a negative, positive, carrier and/or variant of uncertain significant result. Ms. Kozub  was offered a common hereditary  cancer panel (36+ genes) and an expanded pan-cancer panel (70+ genes). Ms. Grenfell was informed of the benefits and limitations of each panel, including that expanded pan-cancer panels contain genes that do not have clear management guidelines at this point in time.  We also discussed that as the number of genes included on a panel increases, the chances of variants of uncertain significance increases. Ms. Orf decided to pursue genetic testing for the CancerNext+RNAinsight gene panel.   The [facility] CancerNext+RNAinsight Panel includes sequencing, rearrangement analysis, and RNA analysis for the following 40 genes: APC, ATM, BAP1, BARD1, BMPR1A, BRCA1, BRCA2, BRIP1, CDH1, CDKN2A, CHEK2, FH, FLCN, MET, MLH1, MSH2, MSH6, MUTYH, NF1, NTHL1, PALB2, PMS2, PTEN, RAD51C, RAD51D, RPS20, SMAD4, STK11, TP53, TSC1, TSC2 and VHL (sequencing and deletion/duplication); AXIN2, HOXB13, MBD4, MSH3, POLD1 and POLE (sequencing only); EPCAM and GREM1 (deletion/duplication only). RNA data is routinely analyzed for use in variant interpretation for all genes.   Based on Ms. Boliver's personal and family history of cancer, she meets medical criteria for genetic testing. Despite that she meets criteria, she may still have an out of pocket cost. We discussed that if her out of pocket cost for testing is over $100, the laboratory will call and confirm whether she wants to proceed with testing.  If the out of pocket cost of testing is less than $100 she will be billed by the genetic testing laboratory.   PLAN: After considering the risks, benefits, and limitations, Ms. Wigley provided informed consent to pursue genetic testing and the blood sample was sent to [facility] for analysis of the CancerNext+RNAinsight. Results should be available within approximately 2-3 weeks' time, at which point they will be disclosed by telephone to Ms. Tirey, as will any additional recommendations warranted by these results. Ms. Schmierer  will receive a summary of her genetic counseling visit and a copy of her results once available. This information will also be available in Epic.   Lastly, we encouraged Ms. Reller to remain in contact with cancer genetics annually so that we can continuously update the family history and inform her of any changes in cancer genetics and testing that may be of benefit for this family.   Ms. Grose's questions were answered to her satisfaction today. Our contact information was provided should additional questions or concerns arise. Thank you for the referral and allowing us  to share in the care of your patient.  Candiss Galeana P. Perri, MS, CGC Licensed, Patent [email or web address] phone: [phone]  I personally spent a total of 60 minutes in the care of the patient today including preparing to see the patient, getting/reviewing separately obtained history, counseling and educating, placing orders, and referring and communicating with other health care professionals. .  The patient was seen alone.  Drs. Lanny Stalls, and/or Gudena were available for questions, if needed..    _______________________________________________________________________ For Office Staff:  Number of people involved in session: 1 Was an Intern/ student involved with case: no

## 2024-07-22 ENCOUNTER — Encounter (HOSPITAL_COMMUNITY): Payer: Self-pay | Admitting: Gynecologic Oncology

## 2024-07-25 ENCOUNTER — Other Ambulatory Visit: Payer: Self-pay | Admitting: Oncology

## 2024-07-25 NOTE — Progress Notes (Signed)
 Gynecologic Oncology Multi-Disciplinary Disposition Conference Note  Date of the Conference: 07/25/2024  Patient Name: Katelyn Winters  Referring Provider: Dr. Cleatus Primary GYN Oncologist: Dr. Viktoria   Stage/Disposition:  Stage IA1, grade 2 endometrioid endometrial adenocarcinoma. Disposition is to close observation.   This Multidisciplinary conference took place involving physicians from Gynecologic Oncology, Medical Oncology, Radiation Oncology, Pathology, Radiology along with the Gynecologic Oncology Nurse Practitioner and Gynecologic Oncology Nurse Navigator.  Comprehensive assessment of the patient's malignancy, staging, need for surgery, chemotherapy, radiation therapy, and need for further testing were reviewed. Supportive measures, both inpatient and following discharge were also discussed. The recommended plan of care is documented. Greater than 35 minutes were spent correlating and coordinating this patient's care.

## 2024-08-01 ENCOUNTER — Encounter (HOSPITAL_COMMUNITY): Payer: Self-pay

## 2024-08-05 ENCOUNTER — Encounter: Payer: Self-pay | Admitting: Gynecologic Oncology

## 2024-08-05 ENCOUNTER — Inpatient Hospital Stay: Attending: Gynecologic Oncology | Admitting: Gynecologic Oncology

## 2024-08-05 VITALS — BP 135/74 | HR 88 | Temp 97.7°F | Resp 19 | Wt 194.0 lb

## 2024-08-05 DIAGNOSIS — C541 Malignant neoplasm of endometrium: Secondary | ICD-10-CM

## 2024-08-05 DIAGNOSIS — Z7189 Other specified counseling: Secondary | ICD-10-CM

## 2024-08-05 NOTE — Patient Instructions (Signed)
 Plan to return to the clinic for a repeat exam in 6-8 weeks.  We will also plan on seeing you for follow up in six months or sooner if needed.  You may have some light spotting after today's exam.   Symptoms to report to your health care team include vaginal bleeding, rectal bleeding, bloating, weight loss without effort, new and persistent pain, new and  persistent fatigue, new leg swelling, new masses (i.e., bumps in your neck or groin), new and persistent cough, new and persistent nausea and vomiting, change in bowel or bladder habits, and any other concerns.

## 2024-08-05 NOTE — Progress Notes (Signed)
 Gynecologic Oncology Return Clinic Visit  08/05/24  Reason for Visit: follow-up, treatment planning  Treatment History: Present to ED on 10/10 with at least 10 days of postmenopausal bleeding, associate intermittent cramping.  Transabdominal ultrasound showed unremarkable uterus although endometrium not visualized. Ovaries not visualized.  CT A/P with thickened endometrium (1.2 cm). No adenopathy or other findings concerning for metastatic disease.  Saw Dr. Cleatus on 10/13 and underwent EMB. 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   07/07/24: Robotic-assisted laparoscopic total hysterectomy with bilateral salpingoophorectomy, SLN biopsy bilaterally, repair of vaginal lacerations   07/2024: Germline testing performed  Interval History: Doing well.  Ready to go back to work.  Reports normal bowel and bladder function.  Scant vaginal spotting.  Past Medical/Surgical History: Past Medical History:  Diagnosis Date   Asthma    COPD (chronic obstructive pulmonary disease) (HCC)    Deaf    Diabetes mellitus without complication (HCC)    Endometrial cancer (HCC)    Family history of breast cancer    Hypertension     Past Surgical History:  Procedure Laterality Date   BLADDER SURGERY     COLONOSCOPY     INJECTION, FOR SENTINEL LYMPH NODE IDENTIFICATION N/A 07/07/2024   Procedure: INJECTION, FOR SENTINEL LYMPH NODE IDENTIFICATION;  Surgeon: Viktoria Comer SAUNDERS, MD;  Location: [facility];  Service: Gynecology;  Laterality: N/A;   ROBOTIC ASSISTED TOTAL HYSTERECTOMY WITH BILATERAL SALPINGO OOPHERECTOMY Bilateral 07/07/2024   Procedure: HYSTERECTOMY, TOTAL, ROBOT-ASSISTED, LAPAROSCOPIC, WITH BILATERAL SALPINGO-OOPHORECTOMY;  Surgeon: Viktoria Comer SAUNDERS, MD;  Location: [facility];  Service: Gynecology;  Laterality: Bilateral;  POSSIBLE LAPAROTOMY    Family History  Problem Relation Age of Onset   Breast cancer Mother 69   Heart attack Father     Blindness Maternal Uncle    Heart attack Paternal Uncle    Breast cancer Maternal Grandmother 38   Heart attack Maternal Grandfather     Social History   Socioeconomic History   Marital status: Single    Spouse name: Not on file   Number of children: Not on file   Years of education: Not on file   Highest education level: Not on file  Occupational History   Not on file  Tobacco Use   Smoking status: Former    Current packs/day: 0.00    Types: Cigarettes    Start date: 11/30/1986    Quit date: 11/29/2021    Years since quitting: 2.6   Smokeless tobacco: Never  Vaping Use   Vaping status: Never Used  Substance and Sexual Activity   Alcohol  use: Not Currently   Drug use: No   Sexual activity: Not Currently  Other Topics Concern   Not on file  Social History Narrative   Not on file   Social Drivers of Health   Financial Resource Strain: Low Risk  (03/19/2021)   Received from [facility]   Overall Financial Resource Strain (CARDIA)    Difficulty of Paying Living Expenses: Not very hard  Food Insecurity: No Food Insecurity (11/12/2021)   Received from [facility]   Hunger Vital Sign    Within the past 12 months, you worried that your food would run out before you got the money to buy more.: Never true    Within the past 12 months, the food you bought just didn't last and you didn't have money to get more.: Never true  Transportation Needs: No Transportation Needs (03/19/2021)   Received from [facility]  PRAPARE - Administrator, Civil Service (Medical): No    Lack of Transportation (Non-Medical): No  Physical Activity: Sufficiently Active (03/19/2021)   Received from [facility]   Exercise Vital Sign    On average, how many days per week do you engage in moderate to strenuous exercise (like a brisk walk)?: 7 days    On average, how many minutes do you engage in exercise at this level?: 70 min  Stress: No Stress Concern Present (03/19/2021)   Received  from [facility] of Occupational Health - Occupational Stress Questionnaire    Feeling of Stress : Not at all  Social Connections: Moderately Isolated (03/19/2021)   Received from [facility]   Social Connection and Isolation Panel    In a typical week, how many times do you talk on the phone with family, friends, or neighbors?: More than three times a week    How often do you get together with friends or relatives?: More than three times a week    How often do you attend church or religious services?: Never    Do you belong to any clubs or organizations such as church groups, unions, fraternal or athletic groups, or school groups?: No    How often do you attend meetings of the clubs or organizations you belong to?: Never    Are you married, widowed, divorced, separated, never married, or living with a partner?: Married    Current Medications:  Current Outpatient Medications:    albuterol  (PROVENTIL ) (2.5 MG/3ML) 0.083% nebulizer solution, Take 3 mLs (2.5 mg total) by nebulization every 6 (six) hours as needed for wheezing or shortness of breath., Disp: 120 mL, Rfl: 11   albuterol  (VENTOLIN  HFA) 108 (90 Base) MCG/ACT inhaler, Inhale 2 puffs into the lungs every 6 (six) hours as needed for wheezing or shortness of breath., Disp: 2 each, Rfl: 12   benzonatate  (TESSALON ) 100 MG capsule, Take 2 capsules (200 mg total) by mouth 3 (three) times daily as needed for cough. (Patient not taking: No sig reported), Disp: 21 capsule, Rfl: 0   fluticasone -salmeterol (ADVAIR) 500-50 MCG/ACT AEPB, Inhale 1 puff into the lungs in the morning and at bedtime., Disp: , Rfl:    Fluticasone -Umeclidin-Vilant (TRELEGY ELLIPTA ) 200-62.5-25 MCG/ACT AEPB, Inhale 1 puff into the lungs daily. (Patient not taking: Reported on 06/30/2024), Disp: 60 each, Rfl: 12   lisinopril (ZESTRIL) 5 MG tablet, Take 5 mg by mouth daily., Disp: , Rfl:    metFORMIN  (GLUCOPHAGE ) 500 MG tablet, Take 1 tablet (500 mg  total) by mouth 2 (two) times daily with a meal. (Patient taking differently: Take 1,000 mg by mouth 2 (two) times daily with a meal.), Disp: 60 tablet, Rfl: 0   senna-docusate (SENOKOT-S) 8.6-50 MG tablet, Take 2 tablets by mouth at bedtime. For AFTER surgery, do not take if having diarrhea, Disp: 30 tablet, Rfl: 0   traMADol  (ULTRAM ) 50 MG tablet, Take 1 tablet (50 mg total) by mouth every 6 (six) hours as needed for moderate pain (pain score 4-6). For AFTER surgery only, do not take and drive, Disp: 10 tablet, Rfl: 0  Review of Systems: Denies appetite changes, fevers, chills, fatigue, unexplained weight changes. Denies hearing loss, neck lumps or masses, mouth sores, ringing in ears or voice changes. Denies cough or wheezing.  Denies shortness of breath. Denies chest pain or palpitations. Denies leg swelling. Denies abdominal distention, pain, blood in stools, constipation, diarrhea, nausea, vomiting, or early satiety. Denies pain  with intercourse, dysuria, frequency, hematuria or incontinence. Denies hot flashes, pelvic pain or vaginal discharge.   Denies joint pain, back pain or muscle pain/cramps. Denies itching, rash, or wounds. Denies dizziness, headaches, numbness or seizures. Denies swollen lymph nodes or glands, denies easy bruising or bleeding. Denies anxiety, depression, confusion, or decreased concentration.  Physical Exam: BP 135/74 (BP Location: Right Arm, Patient Position: Sitting)   Pulse 88   Temp 97.7 F (36.5 C) (Oral)   Resp 19   Wt 194 lb (88 kg)   SpO2 96%   BMI 28.65 kg/m  General: Alert, oriented, no acute distress. HEENT: Posterior oropharynx clear, sclera anicteric. Chest: Unlabored breathing on room air. Abdomen: soft, nontender.  Normoactive bowel sounds.  No masses or hepatosplenomegaly appreciated.  Well-healed incisions. Extremities: Grossly normal range of motion.  Warm, well perfused.  No edema bilaterally. GU: Normal appearing external genitalia  without erythema, excoriation, or lesions.  Speculum exam reveals some agglutination down to the distal vagina.  I was able to break this with my finger gently although there was some bleeding with this.  Unable to palpate up to the cuff given adhesions.  Some Monsel's was placed within the vagina.  Ultimately unable to do a speculum exam.  Laboratory & Radiologic Studies: A. SENTINEL LYMPH NODE, RIGHT OBTURATOR, BIOPSY:      One lymph node, negative for metastatic carcinoma (0/1).  B. SENTINEL LYMPH NODE, LEFT OBTURATOR, BIOPSY:      One lymph node, negative for metastatic carcinoma (0/1).  C. UTERUS, CERVIX, FALLOPIAN TUBE, OVARY, BILATERAL, TOTAL ABDOMINAL HYSTERECTOMY AND BILATERAL SALPINGO-OOPHORECTOMY:      Endometrioid adenocarcinoma, FIGO grade 2.      Carcinoma does not invade myometrium.      No lymphovascular invasion identified.      See oncology table.       Background inactive endometrium.      Leiomyomata.      Benign bilateral ovaries.      Benign bilateral fimbriated fallopian tubes.  ONCOLOGY TABLE:  UTERUS, CARCINOMA OR CARCINOSARCOMA: Resection  Procedure: Total hysterectomy and bilateral salpingo-oophorectomy Histologic Type: Endometrioid adenocarcinoma Histologic Grade: FIGO grade 2 Myometrial Invasion:      Depth of Myometrial Invasion (mm): 0      Myometrial Thickness (mm): 13 mm      Percentage of Myometrial Invasion: 0 Uterine Serosa Involvement: Not identified Cervical stromal Involvement: Not identified Extent of involvement of other tissue/organs: Not identified Peritoneal/Ascitic Fluid: Not applicable Lymphovascular Invasion: Not identified Regional Lymph Nodes:      Pelvic Lymph Nodes Examined:          [2] Sentinel          [0] Non-sentinel          [2] Total      Pelvic Lymph Nodes with Metastasis: 0          Macrometastasis: (>2.0 mm): 0          Micrometastasis: (>0.2 mm and < 2.0 mm): 0          Isolated Tumor Cells (<0.2 mm): 0           Laterality of Lymph Node with Tumor: 0          Extracapsular Extension: 0      Para-aortic Lymph Nodes Examined:          [0] Sentinel          [0] Non-sentinel          [0] Total  Para-aortic Lymph Nodes with Metastasis: NA          Macrometastasis: (>2.0 mm): NA          Micrometastasis:  (>0.2 mm and < 2.0 mm): NA          Isolated Tumor Cells (<0.2 mm): NA          Laterality of Lymph Node with Tumor: NA          Extracapsular Extension: NA Distant Metastasis:      Distant Site(s) Involved: Not applicable Pathologic Stage Classification (pTNM, AJCC 8th Edition): pT1a, pN0 Ancillary Studies: MMR / MSI testing will be ordered Representative Tumor Block: C6, C7 Comment(s): See comment. (v4.2.0.1)   Assessment & Plan: Katelyn Winters is a 68 y.o. 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 MMRd, MSI-H, MLH1 hypermethylation not detected.  Patient doing very well from a postoperative standpoint.  Reviewed continued expectations.  Given difficulty with exam today, we will plan to see her back in 6-8 weeks.  She was monitored briefly after her exam today with no significant blood noted on pad.  At the time of her follow-up visit, she will likely benefit from some vaginal estrogen.  Reviewed pathology from surgery.  We reviewed her pathology report in detail.  Given noninvasive disease and no lymphovascular invasion, she does not meet high intermediate risk criteria.  Discussed recommendation for close observation.  MMR IHC suggestive of Lynch syndrome.  Patient met with our genetic counselors and has now undergone germline testing.  We reviewed signs and symptoms that would be concerning between visits and should prompt a phone call.  We will plan on visits every 6 months, alternating these between our office and her OB/GYN.  I will see her for her first visit in 6 months.  The patient's niece joined by [employer] for this  conversation.  The visit was carried out with the help of an in person sign language interpreter.  20 minutes of total time was spent for this patient encounter, including preparation, face-to-face counseling with the patient and coordination of care, and documentation of the encounter.  Comer Dollar, MD  Division of Gynecologic Oncology  Department of Obstetrics and Gynecology  [facility]

## 2024-08-11 ENCOUNTER — Encounter: Payer: Self-pay | Admitting: Genetic Counselor

## 2024-08-11 ENCOUNTER — Telehealth: Payer: Self-pay | Admitting: Genetic Counselor

## 2024-08-11 DIAGNOSIS — Z1379 Encounter for other screening for genetic and chromosomal anomalies: Secondary | ICD-10-CM | POA: Insufficient documentation

## 2024-08-11 NOTE — Telephone Encounter (Signed)
 LM on interpreter VM that results are back and to please call.  I left CB instructions.

## 2024-08-12 NOTE — Telephone Encounter (Signed)
 Patient left VM that she was returning my call.  I called back and left a second message that I was calling about test results  left CB instructions.

## 2024-08-12 NOTE — Telephone Encounter (Signed)
 This patient called the gyn/onc office. Returned her call to let her know it was the genetic counselor who had left a message. Phone number for genetic counselor's office was left with the message.

## 2024-08-15 ENCOUNTER — Ambulatory Visit: Payer: Self-pay | Admitting: Genetic Counselor

## 2024-08-15 DIAGNOSIS — Z1509 Genetic susceptibility to other malignant neoplasm: Secondary | ICD-10-CM

## 2024-08-15 DIAGNOSIS — Z1507 Genetic susceptibility to malignant neoplasm of urinary tract: Secondary | ICD-10-CM

## 2024-08-15 DIAGNOSIS — Z1379 Encounter for other screening for genetic and chromosomal anomalies: Secondary | ICD-10-CM

## 2024-08-15 NOTE — Progress Notes (Signed)
 REFERRING PROVIDER: Comer Dollar, MD  PRIMARY PROVIDER:  Allen Lauraine CROME, PA-C  PRIMARY REASON FOR VISIT:  1. Genetic testing   2. PMS2-related Lynch syndrome (HNPCC4)   3. Lynch syndrome     GENETIC TEST RESULTS   Patient Name: Katelyn Winters Patient Age: 68 y.o. Encounter Date: 08/15/2024  HPI: Katelyn Winters was previously seen in the [facility] due to a family of cancer and concerns regarding a hereditary predisposition to cancer. Please refer to our prior cancer genetics clinic note for more information regarding Katelyn Winters's medical, social and family histories, and our assessment and recommendations, at the time. Katelyn Winters's recent genetic test results were disclosed to her, as were recommendations warranted by these results. These results and recommendations are discussed in more detail below.   FAMILY HISTORY:  We obtained a detailed, 4-generation family history.  Significant diagnoses are listed below: Family History  Problem Relation Age of Onset   Breast cancer Mother 57   Heart attack Father    Blindness Maternal Uncle    Heart attack Paternal Uncle    Breast cancer Maternal Grandmother 11   Heart attack Maternal Grandfather        The patient does not have children.  She has a brother who is cancer free.  Both parents are deceased.   The significant family history of cancer include: The patient's mother with breast cancer at 35 The patient's maternal grandmother with breast cancer at 17.   Katelyn Winters is unaware of previous family history of genetic testing for hereditary cancer risks.  There is no reported Ashkenazi Jewish ancestry. There is no known consanguinity  GENETIC TESTING:  At the time of Katelyn Winters's visit, we recommended she pursue genetic testing of the CancerNext+RNAinsight test. The genetic testing reported out on August 11, 2024 through the CancerNext+RNA Panel offered by [employer] identified a single,  heterozygous pathogenic Winters mutation called PMS2 EX12del confirming the diagnosis of Lynch syndrome.  A copy of the test report has been scanned into Epic for review.     Clinical Information: Lynch syndrome is characterized by an increased lifetime risk for, generally, adult-onset cancers including colon, endometrial, ovarian, renal pelvis and/or ureter, bladder, gastric, small bowel, pancreas/biliary tract, prostate, breast (female), brain and skin cancer.    The cancers associated with PMS2 pathogenic variants include:  Colorectal cancer, up to an 20% risk Endometrial cancer, up to a 26% risk   Management Recommendations:  Colon Screening/Risk Reduction:  High-quality colonoscopy Begin at age 59-35 years or 2-5 years prior to earliest CRC if diagnosed before age 97. Repeat every 1-2 years.  Gynecological Cancer Screening/Risk Reduction: It is recommended that women with an PMS2 mutation consider hysterectomy with risk-reducing salpingo oophorectomy (RRSO), removal of the ovaries and fallopian tubes, starting at age 60 or once childbearing is completed. Having a RRSO is estimated to reduce the risk of ovarian cancer by up to 96%. There is still a small risk of developing an ovarian-like cancer in the lining of the abdomen, called the peritoneum. Women undergoing a RRSO should be aware of the potential risks and benefits of concurrent hysterectomy. Hormone replacement therapy could be considered based on the physician's discretion. Salpingectomy has been shown to reduce the risk of ovarian cancer in the general population and is an option for premenopausal patients with hereditary cancer risk who are not yet ready for oophorectomy. Endometrial and Ovarian cancer screening is an option for women who chose not to have a  RRSO or who, as of yet, have not completed their family. Current screening methods for both uterine and ovarian cancer are neither sensitive nor specific, meaning that  often early stage uterine and ovarian cancer cannot be diagnosed through this screening.  Screening can also be falsely positive with no cancer present. For this reason, RRSO is recommended over screening. If ovarian cancer screening is recommended by your physician, it could include: CA-125 blood tests Transvaginal ultrasounds Clinical pelvic exams Uterine biopsy   Additional Considerations: Recent studies have suggested that aspirin may be used to reduce the future risk of CRC in patients with LS, but it is emphasized that the optimal dose and duration of therapy should be determined on an individual basis. Patients of reproductive age should be made aware of options for prenatal diagnosis and assisted reproduction including pre-implantation genetic diagnosis. Patients of reproductive age, advise about the risk of a rare recessive syndrome called CMMRD syndrome. If both partners are a carrier of a mutation in the same MMR Winters, then their future offspring will be at risk of having CMMRD syndrome.   This information is based on current understanding of the Winters and may change in the future.   Implications for Family Members: Hereditary predisposition to cancer due to pathogenic variants in the PMS2 Winters has autosomal dominant inheritance. This means that an individual with a pathogenic variant has a 50% chance of passing the condition on to his/her offspring. Her siblings also have a 50% risk for having this mutation. Identification of a pathogenic variant allows for the recognition of at-risk relatives who can pursue testing for the familial variant.  Family members are encouraged to consider genetic testing for this familial pathogenic variant. As there are generally no childhood cancer risks associated with pathogenic variants in the PMS2 Winters, individuals in the family are not recommended to have testing until they reach at least 69 years of age. The exception is when children inherit two  mutations in the same Lynch Winters, one mutation from each parent.  These individuals are at-risk of a rare recessive condition called constitutional mismatch repair deficiency (CMMR-D) syndrome.   If family members have this mutation, they may wish to have their partner tested if they are planning on having children. They may contact our office at [phone] for more information or to schedule an appointment.  Complimentary testing for the familial variant is available for 90 days.  Family members who live outside of the area are encouraged to find a genetic counselor in their area by visiting: [email or web address].  Based on the patient's family history, a statistical model [employer]) was used to estimate her risk of developing breast cancer. This estimates her lifetime risk of developing breast cancer to be approximately 13.1%. The patient's lifetime breast cancer risk is a preliminary estimate based on available information using one of several models endorsed by the American Cancer Society (ACS). The ACS recommends consideration of breast MRI screening as an adjunct to mammography for patients at high risk (defined as 20% or greater lifetime risk). Please note that a woman's breast cancer risk changes over time. It may increase or decrease based on age and any changes to the personal and/or family medical history. The risks and recommendations listed above apply to this patient at this point in time. In the future, Katelyn Winters was NOT found to be at high risk for breast cancer based on her mother and maternal grandmother's diagnoses.    PLAN: Katelyn Winters will need to be  followed as high risk based on her diagnosis of Lynch syndrome.    Katelyn Winters reports having had a colonoscopy but does not remember who performed it.  She agrees to be referred to be seen at [facility].  This note will be copied to that practice in order to set up the appropriate follow up.  2. Katelyn Winters  will follow up with GYN oncology on a 6 month basis initially.  She will be released from their care with recommendations at that time.      3. Katelyn Winters brother should consider genetic testing.  We can see him here in the [facility] or another cancer genetics clinic closer to his home in [place].  He can receive free genetic testing for the single mutation found on Katelyn Winters's genetic testing, although there could be an appointment cost that could be billed to his insurance.  4. We strongly encouraged Katelyn Winters to remain in contact with us  in cancer genetics on an annual basis so we can update Katelyn Winters's personal and family histories, and inform her of advances in cancer genetics that may be of benefit for the entire family. Ms. Stegenga knows she is also welcome to call with any questions or concerns, at any time.   Anzlee Hinesley P. Perri, MS, CGC  Licensed, Patent Attorney [email or web address] phone: [phone]

## 2024-08-15 NOTE — Telephone Encounter (Signed)
 Revealed the positive PMS2 pathogenic mutation.  Ms. Decoste seemed very frantic and wanted us  to talk with her sister in law.  I provided my phone number for her sister in law to call.  I spoke with Valorie Barajas and discussed PMS2 pathogenic mutations.  Explained that this mutation confirms a diagnosis of Lynch syndrome, and it explains Ms. Papin's dx of uterine cancer.  This mutation increased the risk for colon and uterine cancer.  Ms. Casady will be followed by Dr. Lewie team for follow up for her uterine cancer, and we need to let Ms. Difabio's GI MD know of this result as she will need colonoscopy every 1-3 years.  Discussed that Ms. Ries's brother has a 50% chance of having this same mutation and he needs to consider genetic testing to determine how best to follow him for colonoscopy.  It will also let us  know if their children are at increased risk for colon and uterine cancers.  Ms. Louisa Favaro voiced her understanding.

## 2024-08-15 NOTE — Telephone Encounter (Signed)
 Patient called back and I returned her call.  I was unable to reach her.  Left CB instructions including phone number and best time to reach me.

## 2024-08-16 ENCOUNTER — Encounter: Payer: Self-pay | Admitting: Gastroenterology

## 2024-09-02 ENCOUNTER — Encounter

## 2024-09-09 ENCOUNTER — Encounter: Admitting: Internal Medicine

## 2024-09-21 ENCOUNTER — Other Ambulatory Visit: Payer: Self-pay | Admitting: Gastroenterology

## 2024-09-21 ENCOUNTER — Ambulatory Visit: Admitting: *Deleted

## 2024-09-21 VITALS — Ht 66.0 in | Wt 199.0 lb

## 2024-09-21 DIAGNOSIS — Z15068 Genetic susceptibility to other malignant neoplasm of digestive system: Secondary | ICD-10-CM

## 2024-09-21 DIAGNOSIS — Z1211 Encounter for screening for malignant neoplasm of colon: Secondary | ICD-10-CM

## 2024-09-21 MED ORDER — NA SULFATE-K SULFATE-MG SULF 17.5-3.13-1.6 GM/177ML PO SOLN: 1.0000 | Freq: Once | ORAL | 0 refills | Status: AC

## 2024-09-21 NOTE — Progress Notes (Signed)
 Pt's name and DOB verified at the beginning of the pre-visit with 2 identifiers  Permission given to speak with Translator present  Pt denies any difficulty with ambulating,sitting, laying down or rolling side to side  Pt has no issues moving head neck or swallowing  No egg or soy allergy known to patient   No issues known to pt with past sedation  No FH of Malignant Hyperthermia  Pt is not on home 02   Pt is not on blood thinners   Pt denies issues with constipation   Pt is not on dialysis  Pt denise any abnormal heart rhythms   Pt denies any upcoming cardiac testing  Chart not reviewed by CRNA prior to [employer]   Sign language translator present for PV to translate  Visit in person  Pt states weight is 199 lb  Pt given  both [facility] main # and MD on call # prior to instructions.  Informed pt to come in at the time discussed and is shown on PV instructions.  Pt instructed to use [employer] or GoodRx for a price reduction on prep  Instructed pt where to find PV instructions in My Chart and a copy of instructions given to pt Instructed pt on all aspects of written instructions including med holds clothing to wear and foods to eat and not eat as well as after procedure legal restrictions and to call MD on call if needed.. Pt states understanding. Instructed pt to review instructions again prior to procedure and call main # given if has any questions or any issues. Pt states they will.

## 2024-09-22 NOTE — Patient Instructions (Signed)
 We will plan on seeing you for follow up in six months or sooner if needed.   You may have some light spotting after today's exam.    Symptoms to report to your health care team include vaginal bleeding, rectal bleeding, bloating, weight loss without effort, new and persistent pain, new and  persistent fatigue, new leg swelling, new masses (i.e., bumps in your neck or groin), new and persistent cough, new and persistent nausea and vomiting, change in bowel or bladder habits, and any other concerns.

## 2024-09-22 NOTE — Progress Notes (Unsigned)
 Gynecologic Oncology Return Clinic Visit  09/22/24  Reason for Visit: follow-up, treatment planning  Treatment History: Present to ED on 10/10 with at least 10 days of postmenopausal bleeding, associate intermittent cramping.  Transabdominal ultrasound showed unremarkable uterus although endometrium not visualized. Ovaries not visualized.  CT A/P with thickened endometrium (1.2 cm). No adenopathy or other findings concerning for metastatic disease.  Saw Dr. Cleatus on 10/13 and underwent EMB. 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   07/07/24: Robotic-assisted laparoscopic total hysterectomy with bilateral salpingoophorectomy, SLN biopsy bilaterally, repair of vaginal lacerations   07/2024: Germline testing performed  Interval History: Doing well.  Ready to go back to work.  Reports normal bowel and bladder function.  Scant vaginal spotting.  Past Medical/Surgical History: Past Medical History:  Diagnosis Date   Asthma    COPD (chronic obstructive pulmonary disease) (HCC)    Deaf    Diabetes mellitus without complication (HCC)    Endometrial cancer (HCC)    Family history of breast cancer    Hypertension     Past Surgical History:  Procedure Laterality Date   BLADDER SURGERY     COLONOSCOPY     INJECTION, FOR SENTINEL LYMPH NODE IDENTIFICATION N/A 07/07/2024   Procedure: INJECTION, FOR SENTINEL LYMPH NODE IDENTIFICATION;  Surgeon: Viktoria Comer SAUNDERS, MD;  Location: [facility];  Service: Gynecology;  Laterality: N/A;   ROBOTIC ASSISTED TOTAL HYSTERECTOMY WITH BILATERAL SALPINGO OOPHERECTOMY Bilateral 07/07/2024   Procedure: HYSTERECTOMY, TOTAL, ROBOT-ASSISTED, LAPAROSCOPIC, WITH BILATERAL SALPINGO-OOPHORECTOMY;  Surgeon: Viktoria Comer SAUNDERS, MD;  Location: [facility];  Service: Gynecology;  Laterality: Bilateral;  POSSIBLE LAPAROTOMY    Family History  Problem Relation Age of Onset   Breast cancer Mother 73   Heart attack Father     Blindness Maternal Uncle    Heart attack Paternal Uncle    Breast cancer Maternal Grandmother 90   Heart attack Maternal Grandfather    Stomach cancer Neg Hx    Rectal cancer Neg Hx    Esophageal cancer Neg Hx    Colon polyps Neg Hx    Colon cancer Neg Hx     Social History   Socioeconomic History   Marital status: Single    Spouse name: Not on file   Number of children: Not on file   Years of education: Not on file   Highest education level: Not on file  Occupational History   Not on file  Tobacco Use   Smoking status: Former    Current packs/day: 0.00    Types: Cigarettes    Start date: 11/30/1986    Quit date: 11/29/2021    Years since quitting: 2.8   Smokeless tobacco: Never  Vaping Use   Vaping status: Never Used  Substance and Sexual Activity   Alcohol  use: Not Currently   Drug use: No   Sexual activity: Not Currently  Other Topics Concern   Not on file  Social History Narrative   Not on file   Social Drivers of Health   Tobacco Use: Medium Risk (09/20/2024)   Received from [facility]   Patient History    Smoking Tobacco Use: Former    Smokeless Tobacco Use: Never    Passive Exposure: Past  Physicist, Medical Strain: Not on file  Food Insecurity: No Food Insecurity (11/12/2021)   Received from [facility]   Epic    Within the past 12 months, you worried that your food would run out before you got the money  to buy more.: Never true    Within the past 12 months, the food you bought just didn't last and you didn't have money to get more.: Never true  Transportation Needs: Not on file  Physical Activity: Not on file  Stress: Not on file  Social Connections: Not on file  Depression (PHQ2-9): Not on file  Alcohol  Screen: Not on file  Housing: Not on file  Utilities: Not on file  Health Literacy: Not on file    Current Medications:  Current Outpatient Medications:    albuterol  (PROVENTIL ) (2.5 MG/3ML) 0.083% nebulizer solution, Take 3 mLs (2.5 mg  total) by nebulization every 6 (six) hours as needed for wheezing or shortness of breath., Disp: 120 mL, Rfl: 11   albuterol  (VENTOLIN  HFA) 108 (90 Base) MCG/ACT inhaler, Inhale 2 puffs into the lungs every 6 (six) hours as needed for wheezing or shortness of breath., Disp: 2 each, Rfl: 12   benzonatate  (TESSALON ) 100 MG capsule, Take 2 capsules (200 mg total) by mouth 3 (three) times daily as needed for cough., Disp: 21 capsule, Rfl: 0   Fluticasone -Umeclidin-Vilant (TRELEGY ELLIPTA ) 200-62.5-25 MCG/ACT AEPB, Inhale 1 puff into the lungs daily., Disp: 60 each, Rfl: 12   ipratropium-albuterol  (DUONEB) 0.5-2.5 (3) MG/3ML SOLN, Inhale 3 mLs into the lungs as needed., Disp: , Rfl:    Iron-Vitamin C (IRON 100/C PO), Take by mouth daily at 12 noon., Disp: , Rfl:    lisinopril (ZESTRIL) 5 MG tablet, Take 5 mg by mouth daily., Disp: , Rfl:    metFORMIN  (GLUCOPHAGE ) 500 MG tablet, Take 1 tablet (500 mg total) by mouth 2 (two) times daily with a meal., Disp: 60 tablet, Rfl: 0   senna-docusate (SENOKOT-S) 8.6-50 MG tablet, Take 2 tablets by mouth at bedtime. For AFTER surgery, do not take if having diarrhea, Disp: 30 tablet, Rfl: 0   traMADol  (ULTRAM ) 50 MG tablet, Take 1 tablet (50 mg total) by mouth every 6 (six) hours as needed for moderate pain (pain score 4-6). For AFTER surgery only, do not take and drive, Disp: 10 tablet, Rfl: 0  Review of Systems: Denies appetite changes, fevers, chills, fatigue, unexplained weight changes. Denies hearing loss, neck lumps or masses, mouth sores, ringing in ears or voice changes. Denies cough or wheezing.  Denies shortness of breath. Denies chest pain or palpitations. Denies leg swelling. Denies abdominal distention, pain, blood in stools, constipation, diarrhea, nausea, vomiting, or early satiety. Denies pain with intercourse, dysuria, frequency, hematuria or incontinence. Denies hot flashes, pelvic pain or vaginal discharge.   Denies joint pain, back pain or muscle  pain/cramps. Denies itching, rash, or wounds. Denies dizziness, headaches, numbness or seizures. Denies swollen lymph nodes or glands, denies easy bruising or bleeding. Denies anxiety, depression, confusion, or decreased concentration.  Physical Exam: There were no vitals taken for this visit. General: Alert, oriented, no acute distress. HEENT: Posterior oropharynx clear, sclera anicteric. Chest: Unlabored breathing on room air. Abdomen: soft, nontender.  Normoactive bowel sounds.  No masses or hepatosplenomegaly appreciated.  Well-healed incisions. Extremities: Grossly normal range of motion.  Warm, well perfused.  No edema bilaterally. GU: Normal appearing external genitalia without erythema, excoriation, or lesions.  Speculum exam reveals some agglutination down to the distal vagina.  I was able to break this with my finger gently although there was some bleeding with this.  Unable to palpate up to the cuff given adhesions.  Some Monsel's was placed within the vagina.  Ultimately unable to do a speculum exam.  Laboratory &  Radiologic Studies: A. SENTINEL LYMPH NODE, RIGHT OBTURATOR, BIOPSY:      One lymph node, negative for metastatic carcinoma (0/1).  B. SENTINEL LYMPH NODE, LEFT OBTURATOR, BIOPSY:      One lymph node, negative for metastatic carcinoma (0/1).  C. UTERUS, CERVIX, FALLOPIAN TUBE, OVARY, BILATERAL, TOTAL ABDOMINAL HYSTERECTOMY AND BILATERAL SALPINGO-OOPHORECTOMY:      Endometrioid adenocarcinoma, FIGO grade 2.      Carcinoma does not invade myometrium.      No lymphovascular invasion identified.      See oncology table.       Background inactive endometrium.      Leiomyomata.      Benign bilateral ovaries.      Benign bilateral fimbriated fallopian tubes.  ONCOLOGY TABLE:  UTERUS, CARCINOMA OR CARCINOSARCOMA: Resection  Procedure: Total hysterectomy and bilateral salpingo-oophorectomy Histologic Type: Endometrioid adenocarcinoma Histologic Grade: FIGO grade  2 Myometrial Invasion:      Depth of Myometrial Invasion (mm): 0      Myometrial Thickness (mm): 13 mm      Percentage of Myometrial Invasion: 0 Uterine Serosa Involvement: Not identified Cervical stromal Involvement: Not identified Extent of involvement of other tissue/organs: Not identified Peritoneal/Ascitic Fluid: Not applicable Lymphovascular Invasion: Not identified Regional Lymph Nodes:      Pelvic Lymph Nodes Examined:          [2] Sentinel          [0] Non-sentinel          [2] Total      Pelvic Lymph Nodes with Metastasis: 0          Macrometastasis: (>2.0 mm): 0          Micrometastasis: (>0.2 mm and < 2.0 mm): 0          Isolated Tumor Cells (<0.2 mm): 0          Laterality of Lymph Node with Tumor: 0          Extracapsular Extension: 0      Para-aortic Lymph Nodes Examined:          [0] Sentinel          [0] Non-sentinel          [0] Total      Para-aortic Lymph Nodes with Metastasis: NA          Macrometastasis: (>2.0 mm): NA          Micrometastasis:  (>0.2 mm and < 2.0 mm): NA          Isolated Tumor Cells (<0.2 mm): NA          Laterality of Lymph Node with Tumor: NA          Extracapsular Extension: NA Distant Metastasis:      Distant Site(s) Involved: Not applicable Pathologic Stage Classification (pTNM, AJCC 8th Edition): pT1a, pN0 Ancillary Studies: MMR / MSI testing will be ordered Representative Tumor Block: C6, C7 Comment(s): See comment. (v4.2.0.1)   Assessment & Plan: Katelyn Winters is a 69 y.o. 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 MMRd, MSI-H, MLH1 hypermethylation not detected.  Patient doing very well from a postoperative standpoint.  Reviewed continued expectations.  Given difficulty with exam today, we will plan to see her back in 6-8 weeks.  She was monitored briefly after her exam today with no significant blood noted on pad.  At the time of her follow-up visit, she will likely  benefit from some vaginal estrogen.  Reviewed pathology from surgery.  We reviewed her pathology report in detail.  Given noninvasive disease and no lymphovascular invasion, she does not meet high intermediate risk criteria.  Discussed recommendation for close observation.  MMR IHC suggestive of Lynch syndrome.  Patient met with our genetic counselors and has now undergone germline testing.  We reviewed signs and symptoms that would be concerning between visits and should prompt a phone call.  We will plan on visits every 6 months, alternating these between our office and her OB/GYN.  I will see her for her first visit in 6 months.  The patient's niece joined by [employer] for this conversation.  The visit was carried out with the help of an in person sign language interpreter.  20 minutes of total time was spent for this patient encounter, including preparation, face-to-face counseling with the patient and coordination of care, and documentation of the encounter.  Comer Dollar, MD  Division of Gynecologic Oncology  Department of Obstetrics and Gynecology  [facility]

## 2024-09-23 ENCOUNTER — Inpatient Hospital Stay: Payer: Self-pay | Admitting: Gynecologic Oncology

## 2024-09-23 DIAGNOSIS — C541 Malignant neoplasm of endometrium: Secondary | ICD-10-CM

## 2024-09-26 ENCOUNTER — Encounter: Payer: Self-pay | Admitting: Gastroenterology

## 2024-09-27 NOTE — Progress Notes (Addendum)
 Telephone call to patient for follow up post hospital discharge. Patient was discharged from [facility] on 09/24/2024. Admitted for diagnosis: COPD with acute exacerbation .  Left voicemail requesting call back. Coordinators name, contact information, availability provided. MyChart    Call returned to patient. Interpreter # [id number] used for call . Left voicemail.

## 2024-09-28 NOTE — Telephone Encounter (Signed)
 Called on 1/27 and 1/28 [facility] for the patient to call the office back to reschedule missed appt

## 2024-09-30 NOTE — Telephone Encounter (Signed)
 LMOM for the patient to call the office back to reschedule missed app

## 2024-10-05 ENCOUNTER — Telehealth: Payer: Self-pay | Admitting: Gastroenterology

## 2024-10-05 ENCOUNTER — Encounter: Admitting: Gastroenterology

## 2024-10-05 NOTE — Telephone Encounter (Signed)
 Patient came into the office this morning stating that she was unable to do the prep as she did not have the proper types of food to do the prep as she could not get out in the weather.  She has rescheduled the procedures to March 24 at 2:00 p.m.  [employer]

## 2024-10-05 NOTE — Telephone Encounter (Signed)
Noted    GM

## 2024-11-22 ENCOUNTER — Encounter: Admitting: Gastroenterology

## 2025-02-09 ENCOUNTER — Inpatient Hospital Stay: Admitting: Gynecologic Oncology
